# Patient Record
Sex: Female | Born: 1965 | Hispanic: Yes | Marital: Single | Smoking: Never smoker
Health system: Southern US, Community
[De-identification: ages and names within clinical notes are randomized; demographics above are authoritative.]

## PROBLEM LIST (undated history)

## (undated) DIAGNOSIS — R7303 Prediabetes: Secondary | ICD-10-CM

## (undated) DIAGNOSIS — E7800 Pure hypercholesterolemia, unspecified: Secondary | ICD-10-CM

## (undated) HISTORY — DX: Prediabetes: R73.03

## (undated) HISTORY — DX: Pure hypercholesterolemia, unspecified: E78.00

---

## 2019-07-13 HISTORY — PX: ANKLE SURGERY: SHX546

## 2019-08-30 ENCOUNTER — Encounter: Payer: Self-pay | Admitting: Obstetrics and Gynecology

## 2019-09-27 ENCOUNTER — Other Ambulatory Visit: Payer: Self-pay

## 2019-09-27 ENCOUNTER — Ambulatory Visit (INDEPENDENT_AMBULATORY_CARE_PROVIDER_SITE_OTHER): Payer: Self-pay | Admitting: Obstetrics and Gynecology

## 2019-09-27 ENCOUNTER — Encounter: Payer: Self-pay | Admitting: Obstetrics and Gynecology

## 2019-09-27 DIAGNOSIS — D259 Leiomyoma of uterus, unspecified: Secondary | ICD-10-CM | POA: Insufficient documentation

## 2019-09-27 DIAGNOSIS — D251 Intramural leiomyoma of uterus: Secondary | ICD-10-CM

## 2019-09-27 DIAGNOSIS — N83201 Unspecified ovarian cyst, right side: Secondary | ICD-10-CM

## 2019-09-27 NOTE — Patient Instructions (Signed)
Quiste ovrico Ovarian Cyst Un quiste ovrico es una bolsa llena de lquido que se forma en el ovario. Los ovarios son rganos de las mujeres que producen vulos. La mayora de los quistes ovricos desaparecen por s solos y no son cancerosos (son benignos). Otros quistes necesitan tratamiento. Siga estas indicaciones en su casa:  Tome los medicamentos de venta libre y los recetados solamente como se lo haya indicado el mdico.  No conduzca ni use maquinaria pesada mientras toma analgsicos recetados.  Realcese exmenes plvicos y pruebas de Papanicolaou con la frecuencia que le haya indicado el mdico.  Retome sus actividades habituales como se lo haya indicado el mdico. Pregntele al mdico qu actividades son seguras para usted.  No consuma ningn producto que contenga nicotina o tabaco, como cigarrillos y Psychologist, sport and exercise. Si necesita ayuda para dejar de fumar, consulte al mdico.  Concurra a todas las visitas de control como se lo haya indicado el mdico. Esto es importante. Comunquese con un mdico si:  Los perodos menstruales: ? Se retrasan. ? Son irregulares. ? Son dolorosos.   Sus perodos cesan.  Tiene dolor plvico que no desaparece.  Siente presin en la vejiga.  Tiene dificultad para vaciar la vejiga cuando orina.  Siente dolor durante las [employer].  Le aparece alguno de los siguientes sntomas en el abdomen: ? Sensacin de Environmental health practitioner lleno. ? Presin. ? Molestias. ? Dolor que persiste. ? Hinchazn.  Se siente mal constantemente.  Tiene dificultades para defecar (estreimiento).  No tiene el apetito habitual (pierde el apetito).  Tiene un acn muy grave.  Nota un aumento del vello corporal y facial.  Aumenta o disminuye de peso sin hacer modificaciones en su actividad fsica y en su dieta habitual.  Cree que puede estar [place]. Solicite ayuda de inmediato si:  Siente en el abdomen un dolor muy intenso o que  [place].  No puede comer ni beber sin vomitar.  Sbitamente tiene fiebre.  Los perodos menstruales son ms abundantes de lo habitual. Esta informacin no tiene Marine scientist el consejo del mdico. Asegrese de hacerle al mdico cualquier pregunta que tenga. Document Revised: 03/27/2016 Document Reviewed: 05/25/2015 Elsevier Patient Education  2020 [employer].

## 2019-09-27 NOTE — Progress Notes (Signed)
   Subjective:    Patient ID: Michelle Buchanan, female    DOB: [birth year], 54 y.o.   MRN: [id number]  HPI 54 yo G2P2.  SVD x 2.  Pt seen at [facility].  Pt initially seen at [facility].  Pt had concerning ultrasound with fibroid and large right ovarian cyst.  Pt felt more uncomfortable sleeping on her stomach.  Pt is postmenopausal for 6 years.  Pt denies vasomotor symptoms.  Pt has felt "abnormal" since February.  Pt notes early satiety.  Pt feels like she has gained weight.  Pt did note recent ankle surgery.     Review of Systems  Constitutional: Positive for appetite change and unexpected weight change.  HENT: Negative.   Eyes: Negative.   Respiratory: Negative.   Cardiovascular: Negative.   Gastrointestinal: Positive for abdominal pain. Negative for vomiting.       Early satiety  Endocrine: Negative.   Genitourinary: Positive for pelvic pain.  Musculoskeletal: Negative.   Allergic/Immunologic: Negative.   Neurological: Negative.   Hematological: Negative.   Psychiatric/Behavioral: Negative.        Objective:   Physical Exam Vitals reviewed.  Constitutional:      Appearance: Normal appearance. She is normal weight.  HENT:     Head: Normocephalic and atraumatic.  Cardiovascular:     Rate and Rhythm: Normal rate and regular rhythm.     Heart sounds: Normal heart sounds.  Pulmonary:     Effort: Pulmonary effort is normal.     Breath sounds: Normal breath sounds.  Abdominal:     Palpations: Abdomen is soft. There is mass.     Tenderness: There is no abdominal tenderness. There is no guarding.  Genitourinary:    Comments: Bimanual exam: uterus WNL, no CMT Pelvic mass palpated, hard to determine laterality Musculoskeletal:        General: Normal range of motion.  Neurological:     General: No focal deficit present.     Mental Status: She is alert and oriented to person, place, and time.   There were no vitals filed for this visit.  Outside u/s taken  05/2019:  Uterus 8.8 x 4.8 x 5.1 cm, small fibroid 3.6 x 3.3 x 3.4 cm Cystic structure right pelvis 14.5 x 15 x 14.4 cm , multiseptated      Assessment & Plan:   1. Intramural leiomyoma of uterus Reevaluate with u/s - US PELVIC COMPLETE WITH TRANSVAGINAL; Future  2. Right ovarian cyst Repeat u/s, check tumor markers, consider gyn oncology pending scan - CEA - CA 125 - US PELVIC COMPLETE WITH TRANSVAGINAL; Future

## 2019-09-28 LAB — CEA: CEA: 2 ng/mL (ref 0.0–4.7)

## 2019-09-28 LAB — CA 125: Cancer Antigen (CA) 125: 12.6 U/mL (ref 0.0–38.1)

## 2019-10-03 ENCOUNTER — Other Ambulatory Visit: Payer: Self-pay

## 2019-10-03 ENCOUNTER — Ambulatory Visit
Admission: RE | Admit: 2019-10-03 | Discharge: 2019-10-03 | Disposition: A | Discharge status: Home / Self Care | Payer: Self-pay | Source: Ambulatory Visit | Attending: Obstetrics and Gynecology | Admitting: Obstetrics and Gynecology

## 2019-10-03 DIAGNOSIS — N83201 Unspecified ovarian cyst, right side: Secondary | ICD-10-CM | POA: Insufficient documentation

## 2019-10-03 DIAGNOSIS — D251 Intramural leiomyoma of uterus: Secondary | ICD-10-CM | POA: Insufficient documentation

## 2019-10-04 ENCOUNTER — Telehealth: Payer: Self-pay | Admitting: *Deleted

## 2019-10-04 ENCOUNTER — Other Ambulatory Visit (INDEPENDENT_AMBULATORY_CARE_PROVIDER_SITE_OTHER): Payer: Self-pay | Admitting: Obstetrics and Gynecology

## 2019-10-04 ENCOUNTER — Encounter: Payer: Self-pay | Admitting: Gynecologic Oncology

## 2019-10-04 DIAGNOSIS — R1900 Intra-abdominal and pelvic swelling, mass and lump, unspecified site: Secondary | ICD-10-CM

## 2019-10-04 NOTE — Progress Notes (Signed)
Pt to be referred to gyn oncology

## 2019-10-04 NOTE — Telephone Encounter (Signed)
Dr Gordy Councilman office called and scheduled the patient for an appt tomorrow. They will contact the patient

## 2019-10-04 NOTE — Telephone Encounter (Signed)
Attempted to reach the patient by using [employer]; no answer and voicemail not set up. Attempted to reach the patient's family contact, call unable to go through

## 2019-10-05 ENCOUNTER — Encounter: Payer: Self-pay | Admitting: Gynecologic Oncology

## 2019-10-05 ENCOUNTER — Inpatient Hospital Stay: Payer: Self-pay

## 2019-10-05 ENCOUNTER — Other Ambulatory Visit: Payer: Self-pay

## 2019-10-05 ENCOUNTER — Inpatient Hospital Stay: Payer: Self-pay | Attending: Gynecologic Oncology | Admitting: Gynecologic Oncology

## 2019-10-05 VITALS — BP 136/60 | HR 63 | Temp 97.7°F | Resp 18 | Ht 62.0 in | Wt 181.2 lb

## 2019-10-05 DIAGNOSIS — R1900 Intra-abdominal and pelvic swelling, mass and lump, unspecified site: Secondary | ICD-10-CM

## 2019-10-05 DIAGNOSIS — R140 Abdominal distension (gaseous): Secondary | ICD-10-CM

## 2019-10-05 DIAGNOSIS — Z6833 Body mass index (BMI) 33.0-33.9, adult: Secondary | ICD-10-CM | POA: Insufficient documentation

## 2019-10-05 DIAGNOSIS — E669 Obesity, unspecified: Secondary | ICD-10-CM | POA: Insufficient documentation

## 2019-10-05 DIAGNOSIS — E7800 Pure hypercholesterolemia, unspecified: Secondary | ICD-10-CM | POA: Insufficient documentation

## 2019-10-05 DIAGNOSIS — R7303 Prediabetes: Secondary | ICD-10-CM | POA: Insufficient documentation

## 2019-10-05 LAB — BASIC METABOLIC PANEL
Anion gap: 9 (ref 5–15)
BUN: 15 mg/dL (ref 6–20)
CO2: 27 mmol/L (ref 22–32)
Calcium: 9.2 mg/dL (ref 8.9–10.3)
Chloride: 105 mmol/L (ref 98–111)
Creatinine, Ser: 0.78 mg/dL (ref 0.44–1.00)
GFR calc Af Amer: >60 mL/min (ref >60)
GFR calc non Af Amer: >60 mL/min (ref >60)
Glucose, Bld: 113 mg/dL — ABNORMAL HIGH (ref 70–99)
Potassium: 3.6 mmol/L (ref 3.5–5.1)
Sodium: 141 mmol/L (ref 135–145)

## 2019-10-05 MED ORDER — SENNOSIDES-DOCUSATE SODIUM 8.6-50 MG PO TABS: 2.0000 | ORAL_TABLET | Freq: Every day | ORAL | 0 refills | Status: AC

## 2019-10-05 MED ORDER — OXYCODONE HCL 5 MG PO TABS: 5.0000 mg | ORAL_TABLET | ORAL | 0 refills | Status: AC | PRN

## 2019-10-05 MED ORDER — IBUPROFEN 800 MG PO TABS: 800.0000 mg | ORAL_TABLET | Freq: Three times a day (TID) | ORAL | 0 refills | Status: AC | PRN

## 2019-10-05 NOTE — Progress Notes (Signed)
Consult Note: Gyn-Onc Patient seen with interpretor present.   Consult was requested by Dr. Elgie Congo for the evaluation of [facility] 54 y.o. female  CC:  Chief Complaint  Patient presents with  . Pelvic mass    Assessment/Plan:  [name]  is a 54 y.o.  year old with a cystic pelvic mass, likely arising from the left ovary.  It is mildly complex with a single thin septation on ultrasound scan and associated with a normal Ca1 25 and CEA, therefore I have a low suspicion of malignancy.  However due to its large size I am recommending a CT scan of the abdomen and pelvis to evaluate the upper abdomen in addition and adjacent visceral structures.  I am recommending a unilateral salpingo-oophorectomy via mini laparotomy incision and cyst decompression.  I counseled the patient regarding the surgery, its potential for complication including  bleeding, infection, damage to internal organs (such as bladder,ureters, bowels), blood clot, reoperation and rehospitalization. I counseled the patient regarding postoperative recovery.  I am recommending 4 weeks of no heavy lifting.  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  This is reasonable.  We will write her for 2 weeks off of work and an additional 2 weeks of lifting restrictions with no lifting greater than 10 pounds until she is at 4 weeks postop.  At 4 weeks postop she can return to all routine activities provided that she is healing well.  Surgery is scheduled for October 28th.   HPI: [name] is a 54 year old P2 who was seen in consultation at the request of Dr Lynnda Shields for evaluation of a large ovarian cyst.  The patient first noticed abdominal distention and pain with squatting to lifting in March 2021.  She was seen by the health department in May 2021 and ultrasound scan was performed which revealed a uterus measuring 8.8 x 4.8 x 5.1 cm and a small fibroid  measuring 3.6 cm.  There is a cystic structure which was felt at that time to be in the right pelvis and measured 14.5 x 15 x 14.4 cm it was multiseptated.  She was referred to an OB/GYN.  She saw Dr. Lynnda Shields on September 27, 2019 and a transvaginal ultrasound scan was ordered.  This was performed on October 03, 2019 and revealed a uterus measuring 9.6 x 5.2 x 5.5 cm with small leiomyomas within it.  The endometrium was thin and normal at 3 mm.  The right ovary was grossly normal at 2.8 x 0.9 x 1.8 cm.  The left ovary was not visualized.  However a large cystic mass was seen within the pelvis likely arising from the left adnexa extending above the umbilicus measuring 00.1 x 11.3 x 19.9 cm.  It contained a small amount of debris.  There was no mural nodularity or papillary excrescences.  There was a single thin septation.  There was no abnormal blood flow on color or color Doppler imaging.  No pelvic free fluid.  Ca1 25 was drawn on September 27, 2019 and was normal at 12.6, as was CEA at 2.0.  The patient is otherwise a fairly healthy woman who has regular primary care with annual visits.  She has a history of being diagnosed with hypercholesterolemia and prediabetes.  She has no past surgical history other than a left ankle surgery in July 2021 for fracture.  She is obese with a BMI  of 33 kg per metered squared.  Her gynecologic history is remarkable for menopause at approximately age 54.  She has had no postmenopausal bleeding.  She had 2 prior vaginal deliveries.  Her family cancer history is unremarkable.  She works as a Insurance account manager. She lives alone.   Current Meds:  Outpatient Encounter Medications as of 10/05/2019  Medication Sig  . acetaminophen (TYLENOL) 500 MG tablet Take 500 mg by mouth every 6 (six) hours as needed.  . Cholecalciferol 50 MCG (2000 UT) TABS Take by mouth.  . cyclobenzaprine (FLEXERIL) 5 MG tablet Take 5 mg by mouth 3 (three) times daily as needed.  Marland Kitchen ibuprofen (ADVIL)  800 MG tablet Take 800 mg by mouth every 6 (six) hours as needed.   No facility-administered encounter medications on file as of 10/05/2019.    Allergy:  Allergies  Allergen Reactions  . Pollen Extract     Social Hx:   Social History   Socioeconomic History  . Marital status: Single    Spouse name: Not on file  . Number of children: Not on file  . Years of education: Not on file  . Highest education level: Not on file  Occupational History  . Not on file  Tobacco Use  . Smoking status: Never Smoker  . Smokeless tobacco: Never Used  Vaping Use  . Vaping Use: Never used  Substance and Sexual Activity  . Alcohol use: Never  . Drug use: Never  . Sexual activity: Not Currently  Other Topics Concern  . Not on file  Social History Narrative  . Not on file   Social Determinants of Health   Financial Resource Strain:   . Difficulty of Paying Living Expenses: Not on file  Food Insecurity:   . Worried About Charity fundraiser in the Last Year: Not on file  . Ran Out of Food in the Last Year: Not on file  Transportation Needs:   . Lack of Transportation (Medical): Not on file  . Lack of Transportation (Non-Medical): Not on file  Physical Activity:   . Days of Exercise per Week: Not on file  . Minutes of Exercise per Session: Not on file  Stress:   . Feeling of Stress : Not on file  Social Connections:   . Frequency of Communication with Friends and Family: Not on file  . Frequency of Social Gatherings with Friends and Family: Not on file  . Attends Religious Services: Not on file  . Active Member of Clubs or Organizations: Not on file  . Attends Archivist Meetings: Not on file  . Marital Status: Not on file  Intimate Partner Violence:   . Fear of Current or Ex-Partner: Not on file  . Emotionally Abused: Not on file  . Physically Abused: Not on file  . Sexually Abused: Not on file    Past Surgical Hx:  Past Surgical History:  Procedure Laterality Date   . ANKLE SURGERY  07/13/2019    Past Medical Hx:  Past Medical History:  Diagnosis Date  . High cholesterol   . Prediabetes     Past Gynecological History:  See HPI No LMP recorded (lmp unknown). Patient is postmenopausal.  Family Hx:  Family History  Problem Relation Age of Onset  . Hypertension Mother   . Hyperlipidemia Mother   . Diabetes Sister   . Hyperlipidemia Sister   . Diabetes Brother   . Hyperlipidemia Brother     Review of Systems:  Constitutional  Feels well,  ENT Normal appearing ears and nares bilaterally Skin/Breast  No rash, sores, jaundice, itching, dryness Cardiovascular  No chest pain, shortness of breath, or edema  Pulmonary  No cough or wheeze.  Gastro Intestinal  No nausea, vomitting, or diarrhoea. No bright red blood per rectum, change in bowel movement, or constipation.  Genito Urinary  No frequency, urgency, dysuria, + pelvic and abdominal pain.  Musculo Skeletal  No myalgia, arthralgia, joint swelling or pain  Neurologic  No weakness, numbness, change in gait,  Psychology  No depression, anxiety, insomnia.   Vitals:  Blood pressure 136/60, pulse 63, temperature 97.7 F (36.5 C), temperature source Tympanic, resp. rate 18, height 5\' 2"  (1.575 m), weight 181 lb 3.2 oz (82.2 kg), SpO2 98 %.  Physical Exam: WD in NAD Neck  Supple NROM, without any enlargements.  Lymph Node Survey No cervical supraclavicular or inguinal adenopathy Cardiovascular  Pulse normal rate, regularity and rhythm. S1 and S2 normal.  Lungs  Clear to auscultation bilateraly, without wheezes/crackles/rhonchi. Good air movement.  Skin  No rash/lesions/breakdown  Psychiatry  Alert and oriented to person, place, and time  Abdomen  Normoactive bowel sounds, abdomen soft, and obese without evidence of hernia. Palpable mass extending to umbilicus, smooth. Minimally tender.  Back No CVA tenderness Genito Urinary  Vulva/vagina: Normal external female genitalia.   No lesions. No discharge or bleeding.  Bladder/urethra:  No lesions or masses, well supported bladder  Vagina: normal  Cervix: Normal appearing, no lesions.  Uterus:  Small, mobile, no parametrial involvement or nodularity.  Adnexa: cystic pelvic mass extending into upper abdomen. Rectal  deferred Extremities  No bilateral cyanosis, clubbing or edema.  60 minutes of total time was spent for this patient encounter, including preparation, face-to-face counseling with the patient and coordination of care, review of imaging (results and images), communication with the referring provider and documentation of the encounter.   Thereasa Solo, MD  10/05/2019, 10:30 AM

## 2019-10-05 NOTE — Patient Instructions (Signed)
Preparing for your Surgery  Plan for surgery on November 01, 2019 with Dr. Everitt Amber at [facility] will be scheduled for a mini laparotomy (incision on your abdomen), unilateral salpingo-oophorectomy (removal of one ovary and fallopian tube).   Pre-operative Testing -You will receive a phone call from presurgical testing at [facility] to arrange for a pre-operative appointment over the phone, lab appointment, and COVID test. The COVID test normally happens 3 days prior to the surgery and they ask that you self quarantine after the test up until surgery to decrease chance of exposure.  -Bring your insurance card, copy of an advanced directive if applicable, medication list  -At that visit, you will be asked to sign a consent for a possible blood transfusion in case a transfusion becomes necessary during surgery.  The need for a blood transfusion is rare but having consent is a necessary part of your care.     -You should not be taking blood thinners or aspirin at least ten days prior to surgery unless instructed by your surgeon.  -Do not take supplements such as fish oil (omega 3), red yeast rice, turmeric before your surgery.   Day Before Surgery at [place] will be asked to take in a light diet the day before surgery. You will be advised you can have clear liquids up until 3 hours before your surgery.    Eat a light diet the day before surgery.  Examples including soups, broths, toast, yogurt, mashed potatoes.  AVOID GAS PRODUCING FOODS. Things to avoid include carbonated beverages (fizzy beverages), raw fruits and raw vegetables, or beans.   If your bowels are filled with gas, your surgeon will have difficulty visualizing your pelvic organs which increases your surgical risks.  Your role in recovery Your role is to become active as soon as directed by your doctor, while still giving yourself time to heal.  Rest when you feel tired. You will be asked to do the  following in order to speed your recovery:  - Cough and breathe deeply. This helps to clear and expand your lungs and can prevent pneumonia after surgery.  - [place]. Do mild physical activity. Walking or moving your legs help your circulation and body functions return to normal. Do not try to get up or walk alone the first time after surgery.   -If you develop swelling on one leg or the other, pain in the back of your leg, redness/warmth in one of your legs, please call the office or go to the Emergency Room to have a doppler to rule out a blood clot. For shortness of breath, chest pain-seek care in the Emergency Room as soon as possible. - Actively manage your pain. Managing your pain lets you move in comfort. We will ask you to rate your pain on a scale of zero to 10. It is your responsibility to tell your doctor or nurse where and how much you hurt so your pain can be treated.  Special Considerations -If you are diabetic, you may be placed on insulin after surgery to have closer control over your blood sugars to promote healing and recovery.  This does not mean that you will be discharged on insulin.  If applicable, your oral antidiabetics will be resumed when you are tolerating a solid diet.  -Your final pathology results from surgery should be available around one week after surgery and the results will be relayed to you when available.  -  Dr. Lahoma Crocker is the surgeon that assists your GYN Oncologist with surgery.  If you end up staying the night, the next day after your surgery you will either see Dr. Denman George, Dr. Berline Lopes, or Dr. Lahoma Crocker.  -FMLA forms can be faxed to [phone] and please allow 5-7 business days for completion.  Pain Management After Surgery -You have been prescribed your pain medication and bowel regimen medications before surgery so that you can have these available when you are discharged from the hospital. The pain medication is for  use ONLY AFTER surgery and a new prescription will not be given.   -Make sure that you have Tylenol and Ibuprofen at home to use on a regular basis after surgery for pain control. We recommend alternating the medications every hour to six hours since they work differently and are processed in the body differently for pain relief.  -Review the attached handout on narcotic use and their risks and side effects.   Bowel Regimen -You have been prescribed Sennakot-S to take nightly to prevent constipation especially if you are taking the narcotic pain medication intermittently.  It is important to prevent constipation and drink adequate amounts of liquids. You can stop taking this medication when you are not taking pain medication and you are back on your normal bowel routine.  Risks of Surgery Risks of surgery are low but include bleeding, infection, damage to surrounding structures, re-operation, blood clots, and very rarely death.   Blood Transfusion Information (For the consent to be signed before surgery)  We will be checking your blood type before surgery so in case of emergencies, we will know what type of blood you would need.                                            WHAT IS A BLOOD TRANSFUSION?  A transfusion is the replacement of blood or some of its parts. Blood is made up of multiple cells which provide different functions.  Red blood cells carry oxygen and are used for blood loss replacement.  White blood cells fight against infection.  Platelets control bleeding.  Plasma helps clot blood.  Other blood products are available for specialized needs, such as hemophilia or other clotting disorders. BEFORE THE TRANSFUSION  Who gives blood for transfusions?   You may be able to donate blood to be used at a later date on yourself (autologous donation).  Relatives can be asked to donate blood. This is generally not any safer than if you have received blood from a stranger. The  same precautions are taken to ensure safety when a relative's blood is donated.  Healthy volunteers who are fully evaluated to make sure their blood is safe. This is blood bank blood. Transfusion therapy is the safest it has ever been in the practice of medicine. Before blood is taken from a donor, a complete history is taken to make sure that person has no history of diseases nor engages in risky social behavior (examples are intravenous drug use or sexual activity with multiple partners). The donor's travel history is screened to minimize risk of transmitting infections, such as malaria. The donated blood is tested for signs of infectious diseases, such as HIV and hepatitis. The blood is then tested to be sure it is compatible with you in order to minimize the chance of a transfusion reaction. If you or  a relative donates blood, this is often done in anticipation of surgery and is not appropriate for emergency situations. It takes many days to process the donated blood. RISKS AND COMPLICATIONS Although transfusion therapy is very safe and saves many lives, the main dangers of transfusion include:   Getting an infectious disease.  Developing a transfusion reaction. This is an allergic reaction to something in the blood you were given. Every precaution is taken to prevent this. The decision to have a blood transfusion has been considered carefully by your caregiver before blood is given. Blood is not given unless the benefits outweigh the risks.  AFTER SURGERY INSTRUCTIONS  Return to work: 4 weeks if applicable  Activity: 1. Be up and out of the bed during the day.  Take a nap if needed.  You may walk up steps but be careful and use the hand rail.  Stair climbing will tire you more than you think, you may need to stop part way and rest.   2. No lifting or straining for 6 weeks over 10 pounds. No pushing, pulling, straining for 6 weeks.  3. No driving for 1-2 week(s).  Do not drive if you are  taking narcotic pain medicine and make sure that your reaction time has returned.   4. You can shower as soon as the next day after surgery. Shower daily.  Use your regular soap and water (not directly on the incision) and pat your incision(s) dry afterwards; don't rub.  No tub baths or submerging your body in water until cleared by your surgeon. If you have the soap that was given to you by pre-surgical testing that was used before surgery, you do not need to use it afterwards because this can irritate your incisions.   5. No sexual activity and nothing in the vagina for 4 weeks.  6. You may experience a small amount of clear drainage from your incisions, which is normal.  If the drainage persists, increases, or changes color please call the office.  7. Do not use creams, lotions, or ointments such as neosporin on your incisions after surgery until advised by your surgeon because they can cause removal of the dermabond glue on your incisions.    8. You may experience vaginal spotting after surgery or around the 6-8 week mark from surgery when the stitches at the top of the vagina begin to dissolve.  The spotting is normal but if you experience heavy bleeding, call our office.  9. Take Tylenol or ibuprofen first for pain and only use narcotic pain medication for severe pain not relieved by the Tylenol or Ibuprofen.  Monitor your Tylenol intake to a max of 4,000 mg in a 24 hour period. You can alternate these medications after surgery.  Diet: 1. Low sodium Heart Healthy Diet is recommended but you are cleared to resume your normal (before surgery) diet after your procedure.  2. It is safe to use a laxative, such as Miralax or Colace, if you have difficulty moving your bowels. You have been prescribed Sennakot at bedtime every evening to keep bowel movements regular and to prevent constipation.    Wound Care: 1. Keep clean and dry.  Shower daily.  Reasons to call the Doctor:  Fever - Oral  temperature greater than 100.4 degrees Fahrenheit  Foul-smelling vaginal discharge  Difficulty urinating  Nausea and vomiting  Increased pain at the site of the incision that is unrelieved with pain medicine.  Difficulty breathing with or without chest pain  New calf pain especially if only on one side  Sudden, continuing increased vaginal bleeding with or without clots.   Contacts: For questions or concerns you should contact:  Dr. Everitt Amber at [phone]  Joylene John, NP at [phone]  After Hours: call [phone] and have the GYN Oncologist paged/contacted (after 5 pm or on the weekends)

## 2019-10-05 NOTE — H&P (View-Only) (Signed)
Consult Note: Gyn-Onc Patient seen with interpretor present.   Consult was requested by Dr. Elgie Congo for the evaluation of [facility] 54 y.o. female  CC:  Chief Complaint  Patient presents with  . Pelvic mass    Assessment/Plan:  [name] Buchanan  is a 53 y.o.  year old with a cystic pelvic mass, likely arising from the left ovary.  It is mildly complex with a single thin septation on ultrasound scan and associated with a normal Ca1 25 and CEA, therefore I have a low suspicion of malignancy.  However due to its large size I am recommending a CT scan of the abdomen and pelvis to evaluate the upper abdomen in addition and adjacent visceral structures.  I am recommending a unilateral salpingo-oophorectomy via mini laparotomy incision and cyst decompression.  I counseled the patient regarding the surgery, its potential for complication including  bleeding, infection, damage to internal organs (such as bladder,ureters, bowels), blood clot, reoperation and rehospitalization. I counseled the patient regarding postoperative recovery.  I am recommending 4 weeks of no heavy lifting.  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  This is reasonable.  We will write her for 2 weeks off of work and an additional 2 weeks of lifting restrictions with no lifting greater than 10 pounds until she is at 4 weeks postop.  At 4 weeks postop she can return to all routine activities provided that she is healing well.  Surgery is scheduled for October 28th.   HPI: [name] Buchanan is a 54 year old P2 who was seen in consultation at the request of Dr Lynnda Shields for evaluation of a large ovarian cyst.  The patient first noticed abdominal distention and pain with squatting to lifting in March 2021.  She was seen by the health department in May 2021 and ultrasound scan was performed which revealed a uterus measuring 8.8 x 4.8 x 5.1 cm and a small fibroid  measuring 3.6 cm.  There is a cystic structure which was felt at that time to be in the right pelvis and measured 14.5 x 15 x 14.4 cm it was multiseptated.  She was referred to an OB/GYN.  She saw Dr. Lynnda Shields on September 27, 2019 and a transvaginal ultrasound scan was ordered.  This was performed on October 03, 2019 and revealed a uterus measuring 9.6 x 5.2 x 5.5 cm with small leiomyomas within it.  The endometrium was thin and normal at 3 mm.  The right ovary was grossly normal at 2.8 x 0.9 x 1.8 cm.  The left ovary was not visualized.  However a large cystic mass was seen within the pelvis likely arising from the left adnexa extending above the umbilicus measuring 10.9 x 11.3 x 19.9 cm.  It contained a small amount of debris.  There was no mural nodularity or papillary excrescences.  There was a single thin septation.  There was no abnormal blood flow on color or color Doppler imaging.  No pelvic free fluid.  Ca1 25 was drawn on September 27, 2019 and was normal at 12.6, as was CEA at 2.0.  The patient is otherwise a fairly healthy woman who has regular primary care with annual visits.  She has a history of being diagnosed with hypercholesterolemia and prediabetes.  She has no past surgical history other than a left ankle surgery in July 2021 for fracture.  She is obese with a BMI  of 33 kg per metered squared.  Her gynecologic history is remarkable for menopause at approximately age 21.  She has had no postmenopausal bleeding.  She had 2 prior vaginal deliveries.  Her family cancer history is unremarkable.  She works as a Insurance account manager. She lives alone.   Current Meds:  Outpatient Encounter Medications as of 10/05/2019  Medication Sig  . acetaminophen (TYLENOL) 500 MG tablet Take 500 mg by mouth every 6 (six) hours as needed.  . Cholecalciferol 50 MCG (2000 UT) TABS Take by mouth.  . cyclobenzaprine (FLEXERIL) 5 MG tablet Take 5 mg by mouth 3 (three) times daily as needed.  Marland Kitchen ibuprofen (ADVIL)  800 MG tablet Take 800 mg by mouth every 6 (six) hours as needed.   No facility-administered encounter medications on file as of 10/05/2019.    Allergy:  Allergies  Allergen Reactions  . Pollen Extract     Social Hx:   Social History   Socioeconomic History  . Marital status: Single    Spouse name: Not on file  . Number of children: Not on file  . Years of education: Not on file  . Highest education level: Not on file  Occupational History  . Not on file  Tobacco Use  . Smoking status: Never Smoker  . Smokeless tobacco: Never Used  Vaping Use  . Vaping Use: Never used  Substance and Sexual Activity  . Alcohol use: Never  . Drug use: Never  . Sexual activity: Not Currently  Other Topics Concern  . Not on file  Social History Narrative  . Not on file   Social Determinants of Health   Financial Resource Strain:   . Difficulty of Paying Living Expenses: Not on file  Food Insecurity:   . Worried About Charity fundraiser in the Last Year: Not on file  . Ran Out of Food in the Last Year: Not on file  Transportation Needs:   . Lack of Transportation (Medical): Not on file  . Lack of Transportation (Non-Medical): Not on file  Physical Activity:   . Days of Exercise per Week: Not on file  . Minutes of Exercise per Session: Not on file  Stress:   . Feeling of Stress : Not on file  Social Connections:   . Frequency of Communication with Friends and Family: Not on file  . Frequency of Social Gatherings with Friends and Family: Not on file  . Attends Religious Services: Not on file  . Active Member of Clubs or Organizations: Not on file  . Attends Archivist Meetings: Not on file  . Marital Status: Not on file  Intimate Partner Violence:   . Fear of Current or Ex-Partner: Not on file  . Emotionally Abused: Not on file  . Physically Abused: Not on file  . Sexually Abused: Not on file    Past Surgical Hx:  Past Surgical History:  Procedure Laterality Date   . ANKLE SURGERY  07/13/2019    Past Medical Hx:  Past Medical History:  Diagnosis Date  . High cholesterol   . Prediabetes     Past Gynecological History:  See HPI No LMP recorded (lmp unknown). Patient is postmenopausal.  Family Hx:  Family History  Problem Relation Age of Onset  . Hypertension Mother   . Hyperlipidemia Mother   . Diabetes Sister   . Hyperlipidemia Sister   . Diabetes Brother   . Hyperlipidemia Brother     Review of Systems:  Constitutional  Feels well,  ENT Normal appearing ears and nares bilaterally Skin/Breast  No rash, sores, jaundice, itching, dryness Cardiovascular  No chest pain, shortness of breath, or edema  Pulmonary  No cough or wheeze.  Gastro Intestinal  No nausea, vomitting, or diarrhoea. No bright red blood per rectum, change in bowel movement, or constipation.  Genito Urinary  No frequency, urgency, dysuria, + pelvic and abdominal pain.  Musculo Skeletal  No myalgia, arthralgia, joint swelling or pain  Neurologic  No weakness, numbness, change in gait,  Psychology  No depression, anxiety, insomnia.   Vitals:  Blood pressure 136/60, pulse 63, temperature 97.7 F (36.5 C), temperature source Tympanic, resp. rate 18, height 5\' 2"  (1.575 m), weight 181 lb 3.2 oz (82.2 kg), SpO2 98 %.  Physical Exam: WD in NAD Neck  Supple NROM, without any enlargements.  Lymph Node Survey No cervical supraclavicular or inguinal adenopathy Cardiovascular  Pulse normal rate, regularity and rhythm. S1 and S2 normal.  Lungs  Clear to auscultation bilateraly, without wheezes/crackles/rhonchi. Good air movement.  Skin  No rash/lesions/breakdown  Psychiatry  Alert and oriented to person, place, and time  Abdomen  Normoactive bowel sounds, abdomen soft, and obese without evidence of hernia. Palpable mass extending to umbilicus, smooth. Minimally tender.  Back No CVA tenderness Genito Urinary  Vulva/vagina: Normal external female genitalia.   No lesions. No discharge or bleeding.  Bladder/urethra:  No lesions or masses, well supported bladder  Vagina: normal  Cervix: Normal appearing, no lesions.  Uterus:  Small, mobile, no parametrial involvement or nodularity.  Adnexa: cystic pelvic mass extending into upper abdomen. Rectal  deferred Extremities  No bilateral cyanosis, clubbing or edema.  60 minutes of total time was spent for this patient encounter, including preparation, face-to-face counseling with the patient and coordination of care, review of imaging (results and images), communication with the referring provider and documentation of the encounter.   Thereasa Solo, MD  10/05/2019, 10:30 AM

## 2019-10-08 ENCOUNTER — Other Ambulatory Visit: Payer: Self-pay | Admitting: Gynecologic Oncology

## 2019-10-08 DIAGNOSIS — R1900 Intra-abdominal and pelvic swelling, mass and lump, unspecified site: Secondary | ICD-10-CM

## 2019-10-11 ENCOUNTER — Ambulatory Visit (HOSPITAL_COMMUNITY)
Admission: RE | Admit: 2019-10-11 | Discharge: 2019-10-11 | Disposition: A | Discharge status: Home / Self Care | Payer: Self-pay | Source: Ambulatory Visit | Attending: Gynecologic Oncology | Admitting: Gynecologic Oncology

## 2019-10-11 ENCOUNTER — Other Ambulatory Visit: Payer: Self-pay

## 2019-10-11 DIAGNOSIS — R140 Abdominal distension (gaseous): Secondary | ICD-10-CM | POA: Insufficient documentation

## 2019-10-11 DIAGNOSIS — R1900 Intra-abdominal and pelvic swelling, mass and lump, unspecified site: Secondary | ICD-10-CM | POA: Insufficient documentation

## 2019-10-11 MED ORDER — IOHEXOL 300 MG/ML  SOLN
100.0000 mL | Freq: Once | INTRAMUSCULAR | Status: AC | PRN
  Administered 2019-10-11: 100 mL via INTRAVENOUS

## 2019-10-12 ENCOUNTER — Telehealth: Payer: Self-pay

## 2019-10-12 NOTE — Telephone Encounter (Signed)
Attempted  to reach patient to give results of CT scan from 10-11-19. No message left as the voice mail is not set up. Will try again later to reach patient.

## 2019-10-12 NOTE — Telephone Encounter (Signed)
Told ms  [facility] that the Ct shows the known pelvic mass. There are no enlarged lymph nodes or signs of metastatic or spread of cancer per Joylene John, NP. Pt verbalized understanding.

## 2019-10-24 NOTE — Progress Notes (Signed)
DUE TO COVID-19 ONLY ONE VISITOR IS ALLOWED TO COME WITH YOU AND STAY IN THE WAITING ROOM ONLY DURING PRE OP AND PROCEDURE DAY OF SURGERY. THE 1 VISITOR  MAY VISIT WITH YOU AFTER SURGERY IN YOUR PRIVATE ROOM DURING VISITING HOURS ONLY!  YOU NEED TO HAVE A COVID 19 TEST ON__10/25/2021 _____ @_______ , THIS TEST MUST BE DONE BEFORE SURGERY,  COVID TESTING SITE [address] [place] [address], IT IS ON THE RIGHT GOING OUT [address] APPROXIMATELY  2 MINUTES PAST [employer] ON THE RIGHT. ONCE YOUR COVID TEST IS COMPLETED,  PLEASE BEGIN THE QUARANTINE INSTRUCTIONS AS OUTLINED IN YOUR HANDOUT.                [place]  10/24/2019   Your procedure is scheduled on: 11/01/2019    Report to [facility] Main  Entrance   Report to admitting at     1100 AM     Call this number if you have problems the morning of surgery [phone]    Remember: Do not eat food , candy gum or mints :After Midnight. You may have clear liquids from midnight until 1000am    CLEAR LIQUID DIET   Foods Allowed                                                                       Coffee and tea, regular and decaf                              Plain Jell-O any favor except red or purple                                            Fruit ices (not with fruit pulp)                                      Iced Popsicles                                     Carbonated beverages, regular and diet                                    Cranberry, grape and apple juices Sports drinks like Gatorade Lightly seasoned clear broth or consume(fat free) Sugar, honey syrup   _____________________________________________________________________    BRUSH YOUR TEETH MORNING OF SURGERY AND RINSE YOUR MOUTH OUT, NO CHEWING GUM CANDY OR MINTS.     Take these medicines the morning of surgery with A SIP OF WATER: none                                  You may not have any metal on your body  including hair pins and  piercings  Do not wear jewelry, make-up, lotions, powders or perfumes, deodorant             Do not wear nail polish on your fingernails.  Do not shave  48 hours prior to surgery.              Men may shave face and neck.   Do not bring valuables to the hospital. [place].  Contacts, dentures or bridgework may not be worn into surgery.  Leave suitcase in the car. After surgery it may be brought to your room.     Patients discharged the day of surgery will not be allowed to drive home. IF YOU ARE HAVING SURGERY AND GOING HOME THE SAME DAY, YOU MUST HAVE AN ADULT TO DRIVE YOU HOME AND BE WITH YOU FOR 24 HOURS. YOU MAY GO HOME BY TAXI OR UBER OR ORTHERWISE, BUT AN ADULT MUST ACCOMPANY YOU HOME AND STAY WITH YOU FOR 24 HOURS.  Name and phone number of your driver:  Special Instructions: N/A              Please read over the following fact sheets you were given: _____________________________________________________________________  [facility] - Preparing for Surgery Before surgery, you can play an important role.  Because skin is not sterile, your skin needs to be as free of germs as possible.  You can reduce the number of germs on your skin by washing with CHG (chlorahexidine gluconate) soap before surgery.  CHG is an antiseptic cleaner which kills germs and bonds with the skin to continue killing germs even after washing. Please DO NOT use if you have an allergy to CHG or antibacterial soaps.  If your skin becomes reddened/irritated stop using the CHG and inform your nurse when you arrive at Short Stay. Do not shave (including legs and underarms) for at least 48 hours prior to the first CHG shower.  You may shave your face/neck. Please follow these instructions carefully:  1.  Shower with CHG Soap the night before surgery and the  morning of Surgery.  2.  If you choose to wash your hair, wash your hair first  as usual with your  normal  shampoo.  3.  After you shampoo, rinse your hair and body thoroughly to remove the  shampoo.                           4.  Use CHG as you would any other liquid soap.  You can apply chg directly  to the skin and wash                       Gently with a scrungie or clean washcloth.  5.  Apply the CHG Soap to your body ONLY FROM THE NECK DOWN.   Do not use on face/ open                           Wound or open sores. Avoid contact with eyes, ears mouth and genitals (private parts).                       Wash face,  Genitals (private parts) with your normal soap.             6.  Wash thoroughly, paying special attention to the area where your surgery  will be performed.  7.  Thoroughly rinse your body with warm water from the neck down.  8.  DO NOT shower/wash with your normal soap after using and rinsing off  the CHG Soap.                9.  Pat yourself dry with a clean towel.            10.  Wear clean pajamas.            11.  Place clean sheets on your bed the night of your first shower and do not  sleep with pets. Day of Surgery : Do not apply any lotions/deodorants the morning of surgery.  Please wear clean clothes to the hospital/surgery center.  FAILURE TO FOLLOW THESE INSTRUCTIONS MAY RESULT IN THE CANCELLATION OF YOUR SURGERY PATIENT SIGNATURE_________________________________  NURSE SIGNATURE__________________________________  ________________________________________________________________________

## 2019-10-26 ENCOUNTER — Encounter (HOSPITAL_COMMUNITY): Payer: Self-pay

## 2019-10-26 ENCOUNTER — Encounter (HOSPITAL_COMMUNITY)
Admission: RE | Admit: 2019-10-26 | Discharge: 2019-10-26 | Disposition: A | Discharge status: Home / Self Care | Payer: Self-pay | Source: Ambulatory Visit | Attending: Gynecologic Oncology | Admitting: Gynecologic Oncology

## 2019-10-26 ENCOUNTER — Other Ambulatory Visit: Payer: Self-pay

## 2019-10-26 DIAGNOSIS — Z01818 Encounter for other preprocedural examination: Secondary | ICD-10-CM | POA: Insufficient documentation

## 2019-10-26 HISTORY — DX: Prediabetes: R73.03

## 2019-10-26 LAB — BASIC METABOLIC PANEL
Anion gap: 9 (ref 5–15)
BUN: 18 mg/dL (ref 6–20)
CO2: 26 mmol/L (ref 22–32)
Calcium: 8.7 mg/dL — ABNORMAL LOW (ref 8.9–10.3)
Chloride: 105 mmol/L (ref 98–111)
Creatinine, Ser: 0.72 mg/dL (ref 0.44–1.00)
GFR, Estimated: >60 mL/min (ref >60)
Glucose, Bld: 96 mg/dL (ref 70–99)
Potassium: 4 mmol/L (ref 3.5–5.1)
Sodium: 140 mmol/L (ref 135–145)

## 2019-10-26 LAB — CBC
HCT: 39.7 % (ref 36.0–46.0)
Hemoglobin: 13.3 g/dL (ref 12.0–15.0)
MCH: 30 pg (ref 26.0–34.0)
MCHC: 33.5 g/dL (ref 30.0–36.0)
MCV: 89.4 fL (ref 80.0–100.0)
Platelets: 179 10*3/uL (ref 150–400)
RBC: 4.44 MIL/uL (ref 3.87–5.11)
RDW: 12.3 % (ref 11.5–15.5)
WBC: 6.5 10*3/uL (ref 4.0–10.5)
nRBC: 0 % (ref 0.0–0.2)

## 2019-10-26 NOTE — Progress Notes (Signed)
Patient unable to do urine sampe at time of preop on 10/26/2019.  Pt to come on Monday to do urinalysis on way to Covid test prior to surgery.

## 2019-10-27 LAB — HEMOGLOBIN A1C
Hgb A1c MFr Bld: 5.8 % — ABNORMAL HIGH (ref 4.8–5.6)
Mean Plasma Glucose: 120 mg/dL

## 2019-10-29 ENCOUNTER — Telehealth: Payer: Self-pay

## 2019-10-29 ENCOUNTER — Encounter (HOSPITAL_COMMUNITY)
Admission: RE | Admit: 2019-10-29 | Discharge: 2019-10-29 | Disposition: A | Discharge status: Home / Self Care | Payer: Self-pay | Source: Ambulatory Visit | Attending: Gynecologic Oncology | Admitting: Gynecologic Oncology

## 2019-10-29 ENCOUNTER — Other Ambulatory Visit (HOSPITAL_COMMUNITY)
Admission: RE | Admit: 2019-10-29 | Discharge: 2019-10-29 | Disposition: A | Discharge status: Home / Self Care | Payer: HRSA Program | Source: Ambulatory Visit | Attending: Gynecologic Oncology | Admitting: Gynecologic Oncology

## 2019-10-29 DIAGNOSIS — Z20822 Contact with and (suspected) exposure to covid-19: Secondary | ICD-10-CM | POA: Insufficient documentation

## 2019-10-29 LAB — URINALYSIS, ROUTINE W REFLEX MICROSCOPIC
Bacteria, UA: NONE SEEN
Bilirubin Urine: NEGATIVE
Glucose, UA: NEGATIVE mg/dL
Ketones, ur: NEGATIVE mg/dL
Leukocytes,Ua: NEGATIVE
Nitrite: NEGATIVE
Protein, ur: NEGATIVE mg/dL
Specific Gravity, Urine: 1.008 (ref 1.005–1.030)
pH: 7 (ref 5.0–8.0)

## 2019-10-29 LAB — SARS CORONAVIRUS 2 (TAT 6-24 HRS): SARS Coronavirus 2: NEGATIVE

## 2019-10-29 NOTE — Telephone Encounter (Signed)
Told Michelle Buchanan that the Hgb A1c was 5.8. This level in the pre-diabetic range and close to the diabetic range. She needs to watch her intake of concentrated sweets and carbohydrates such as bread and pasta. A high blood sugar can affect her healing after surgery. Pt verbalized understanding.

## 2019-10-29 NOTE — Progress Notes (Signed)
U/A done 10/29/2019 faxed via epic to DR Denman George and [facility],NP

## 2019-10-30 ENCOUNTER — Telehealth: Payer: Self-pay

## 2019-10-30 NOTE — Telephone Encounter (Signed)
Reviewed written pre op instructions with daughter Sharyn Lull, who speaks english. She understands the instructions and will be there to help mother with shower etc.  If she has any questions, She will call the office tomorrow at [phone].

## 2019-10-30 NOTE — Telephone Encounter (Signed)
Attempted to Reach patient via pacific interpreters service  Twice in one call.  No answer. Patient does not have a voice mail set up.

## 2019-10-31 NOTE — Anesthesia Preprocedure Evaluation (Addendum)
Anesthesia Evaluation  Patient identified by MRN, date of birth, ID band Patient awake    Reviewed: Allergy & Precautions, NPO status , Patient's Chart, lab work & pertinent test results  Airway Mallampati: II  TM Distance: >3 FB Neck ROM: Full    Dental no notable dental hx. (+) Missing,    Pulmonary neg pulmonary ROS,    Pulmonary exam normal breath sounds clear to auscultation       Cardiovascular Exercise Tolerance: Good Normal cardiovascular exam Rhythm:Regular Rate:Normal  10/26/19 EKG SB R 54   Neuro/Psych negative neurological ROS  negative psych ROS   GI/Hepatic negative GI ROS, Neg liver ROS,   Endo/Other  negative endocrine ROS  Renal/GU K+ 4.0 Cr 0.72  Female GU complaint Cystic pelvic mass from L ovary    Musculoskeletal negative musculoskeletal ROS (+)   Abdominal   Peds  Hematology Hgb 13.3 Plt 179   Anesthesia Other Findings   Reproductive/Obstetrics                          Anesthesia Physical Anesthesia Plan  ASA: III  Anesthesia Plan: General   Post-op Pain Management:    Induction: Intravenous  PONV Risk Score and Plan: 4 or greater and Midazolam, Treatment may vary due to age or medical condition, Ondansetron and Dexamethasone  Airway Management Planned: Oral ETT  Additional Equipment: None  Intra-op Plan:   Post-operative Plan: Extubation in OR  Informed Consent: I have reviewed the patients History and Physical, chart, labs and discussed the procedure including the risks, benefits and alternatives for the proposed anesthesia with the patient or authorized representative who has indicated his/her understanding and acceptance.     Dental advisory given and Interpreter used for interveiw  Plan Discussed with: Anesthesiologist and CRNA  Anesthesia Plan Comments: (GA)       Anesthesia Quick Evaluation

## 2019-11-01 ENCOUNTER — Encounter (HOSPITAL_COMMUNITY)
Admission: RE | Disposition: A | Discharge status: Home / Self Care | Payer: Self-pay | Source: Home / Self Care | Attending: Gynecologic Oncology

## 2019-11-01 ENCOUNTER — Encounter (HOSPITAL_COMMUNITY): Payer: Self-pay | Admitting: Gynecologic Oncology

## 2019-11-01 ENCOUNTER — Ambulatory Visit (HOSPITAL_COMMUNITY): Payer: Self-pay | Admitting: Anesthesiology

## 2019-11-01 ENCOUNTER — Ambulatory Visit (HOSPITAL_COMMUNITY)
Admission: RE | Admit: 2019-11-01 | Discharge: 2019-11-01 | Disposition: A | Discharge status: Home / Self Care | Payer: Self-pay | Attending: Gynecologic Oncology | Admitting: Gynecologic Oncology

## 2019-11-01 DIAGNOSIS — D259 Leiomyoma of uterus, unspecified: Secondary | ICD-10-CM | POA: Diagnosis present

## 2019-11-01 DIAGNOSIS — Z79899 Other long term (current) drug therapy: Secondary | ICD-10-CM | POA: Insufficient documentation

## 2019-11-01 DIAGNOSIS — E7800 Pure hypercholesterolemia, unspecified: Secondary | ICD-10-CM | POA: Insufficient documentation

## 2019-11-01 DIAGNOSIS — R1900 Intra-abdominal and pelvic swelling, mass and lump, unspecified site: Secondary | ICD-10-CM | POA: Diagnosis present

## 2019-11-01 DIAGNOSIS — Z8349 Family history of other endocrine, nutritional and metabolic diseases: Secondary | ICD-10-CM | POA: Insufficient documentation

## 2019-11-01 DIAGNOSIS — E669 Obesity, unspecified: Secondary | ICD-10-CM | POA: Insufficient documentation

## 2019-11-01 DIAGNOSIS — N83201 Unspecified ovarian cyst, right side: Secondary | ICD-10-CM | POA: Diagnosis present

## 2019-11-01 DIAGNOSIS — Z8249 Family history of ischemic heart disease and other diseases of the circulatory system: Secondary | ICD-10-CM | POA: Insufficient documentation

## 2019-11-01 DIAGNOSIS — D270 Benign neoplasm of right ovary: Secondary | ICD-10-CM

## 2019-11-01 DIAGNOSIS — Z833 Family history of diabetes mellitus: Secondary | ICD-10-CM | POA: Insufficient documentation

## 2019-11-01 DIAGNOSIS — Z6833 Body mass index (BMI) 33.0-33.9, adult: Secondary | ICD-10-CM | POA: Insufficient documentation

## 2019-11-01 DIAGNOSIS — Z791 Long term (current) use of non-steroidal anti-inflammatories (NSAID): Secondary | ICD-10-CM | POA: Insufficient documentation

## 2019-11-01 DIAGNOSIS — D271 Benign neoplasm of left ovary: Secondary | ICD-10-CM | POA: Insufficient documentation

## 2019-11-01 HISTORY — PX: SALPINGOOPHORECTOMY: SHX82

## 2019-11-01 LAB — TYPE AND SCREEN
ABO/RH(D): O POS
Antibody Screen: NEGATIVE

## 2019-11-01 LAB — ABO/RH: ABO/RH(D): O POS

## 2019-11-01 SURGERY — SALPINGO-OOPHORECTOMY, OPEN
Anesthesia: General | Laterality: Left

## 2019-11-01 MED ORDER — DEXAMETHASONE SODIUM PHOSPHATE 10 MG/ML IJ SOLN
INTRAMUSCULAR | Status: DC | PRN
  Administered 2019-11-01: 10 mg via INTRAVENOUS

## 2019-11-01 MED ORDER — HYDROMORPHONE HCL 1 MG/ML IJ SOLN
INTRAMUSCULAR | Status: AC
  Administered 2019-11-01: 0.5 mg via INTRAVENOUS
  Filled 2019-11-01: qty 1

## 2019-11-01 MED ORDER — AMISULPRIDE (ANTIEMETIC) 5 MG/2ML IV SOLN: 10.0000 mg | Freq: Once | INTRAVENOUS | Status: DC | PRN

## 2019-11-01 MED ORDER — LIDOCAINE 2% (20 MG/ML) 5 ML SYRINGE
INTRAMUSCULAR | Status: AC
  Filled 2019-11-01: qty 5

## 2019-11-01 MED ORDER — FENTANYL CITRATE (PF) 250 MCG/5ML IJ SOLN
INTRAMUSCULAR | Status: AC
  Filled 2019-11-01: qty 5

## 2019-11-01 MED ORDER — EPHEDRINE 5 MG/ML INJ
INTRAVENOUS | Status: AC
  Filled 2019-11-01: qty 10

## 2019-11-01 MED ORDER — MIDAZOLAM HCL 2 MG/2ML IJ SOLN
INTRAMUSCULAR | Status: AC
  Filled 2019-11-01: qty 2

## 2019-11-01 MED ORDER — EPHEDRINE SULFATE-NACL 50-0.9 MG/10ML-% IV SOSY
PREFILLED_SYRINGE | INTRAVENOUS | Status: DC | PRN
  Administered 2019-11-01: 10 mg via INTRAVENOUS

## 2019-11-01 MED ORDER — SUGAMMADEX SODIUM 200 MG/2ML IV SOLN
INTRAVENOUS | Status: DC | PRN
  Administered 2019-11-01: 170 mg via INTRAVENOUS

## 2019-11-01 MED ORDER — KETOROLAC TROMETHAMINE 30 MG/ML IJ SOLN
INTRAMUSCULAR | Status: DC | PRN
  Administered 2019-11-01: 30 mg via INTRAVENOUS

## 2019-11-01 MED ORDER — GABAPENTIN 300 MG PO CAPS
300.0000 mg | ORAL_CAPSULE | ORAL | Status: AC
  Administered 2019-11-01: 300 mg via ORAL
  Filled 2019-11-01: qty 1

## 2019-11-01 MED ORDER — ROCURONIUM BROMIDE 10 MG/ML (PF) SYRINGE
PREFILLED_SYRINGE | INTRAVENOUS | Status: DC | PRN
  Administered 2019-11-01: 50 mg via INTRAVENOUS

## 2019-11-01 MED ORDER — ROCURONIUM BROMIDE 10 MG/ML (PF) SYRINGE
PREFILLED_SYRINGE | INTRAVENOUS | Status: AC
  Filled 2019-11-01: qty 10

## 2019-11-01 MED ORDER — CEFAZOLIN SODIUM-DEXTROSE 2-4 GM/100ML-% IV SOLN
2.0000 g | INTRAVENOUS | Status: AC
  Administered 2019-11-01: 2 g via INTRAVENOUS
  Filled 2019-11-01: qty 100

## 2019-11-01 MED ORDER — ENOXAPARIN SODIUM 40 MG/0.4ML ~~LOC~~ SOLN
40.0000 mg | SUBCUTANEOUS | Status: AC
  Administered 2019-11-01: 40 mg via SUBCUTANEOUS
  Filled 2019-11-01: qty 0.4

## 2019-11-01 MED ORDER — SUCCINYLCHOLINE CHLORIDE 200 MG/10ML IV SOSY
PREFILLED_SYRINGE | INTRAVENOUS | Status: AC
  Filled 2019-11-01: qty 10

## 2019-11-01 MED ORDER — LIDOCAINE 2% (20 MG/ML) 5 ML SYRINGE
INTRAMUSCULAR | Status: DC | PRN
  Administered 2019-11-01: 80 mg via INTRAVENOUS

## 2019-11-01 MED ORDER — BUPIVACAINE LIPOSOME 1.3 % IJ SUSP
20.0000 mL | Freq: Once | INTRAMUSCULAR | Status: DC
  Filled 2019-11-01: qty 20

## 2019-11-01 MED ORDER — ACETAMINOPHEN 500 MG PO TABS
1000.0000 mg | ORAL_TABLET | ORAL | Status: AC
  Administered 2019-11-01: 1000 mg via ORAL
  Filled 2019-11-01: qty 2

## 2019-11-01 MED ORDER — ONDANSETRON HCL 4 MG/2ML IJ SOLN: 4.0000 mg | Freq: Once | INTRAMUSCULAR | Status: DC | PRN

## 2019-11-01 MED ORDER — SODIUM CHLORIDE (PF) 0.9 % IJ SOLN
INTRAMUSCULAR | Status: AC
  Filled 2019-11-01: qty 10

## 2019-11-01 MED ORDER — OXYCODONE HCL 5 MG PO TABS: 5.0000 mg | ORAL_TABLET | Freq: Once | ORAL | Status: DC | PRN

## 2019-11-01 MED ORDER — DEXAMETHASONE SODIUM PHOSPHATE 4 MG/ML IJ SOLN: 4.0000 mg | INTRAMUSCULAR | Status: DC

## 2019-11-01 MED ORDER — PROPOFOL 10 MG/ML IV BOLUS
INTRAVENOUS | Status: AC
  Filled 2019-11-01: qty 20

## 2019-11-01 MED ORDER — ORAL CARE MOUTH RINSE: 15.0000 mL | Freq: Once | OROMUCOSAL | Status: AC

## 2019-11-01 MED ORDER — SCOPOLAMINE 1 MG/3DAYS TD PT72
1.0000 | MEDICATED_PATCH | TRANSDERMAL | Status: DC
  Administered 2019-11-01: 1.5 mg via TRANSDERMAL
  Filled 2019-11-01: qty 1

## 2019-11-01 MED ORDER — BUPIVACAINE HCL (PF) 0.25 % IJ SOLN
INTRAMUSCULAR | Status: AC
  Filled 2019-11-01: qty 30

## 2019-11-01 MED ORDER — ONDANSETRON HCL 4 MG/2ML IJ SOLN
INTRAMUSCULAR | Status: AC
  Filled 2019-11-01: qty 2

## 2019-11-01 MED ORDER — PROPOFOL 10 MG/ML IV BOLUS
INTRAVENOUS | Status: DC | PRN
  Administered 2019-11-01: 150 mg via INTRAVENOUS

## 2019-11-01 MED ORDER — DEXAMETHASONE SODIUM PHOSPHATE 10 MG/ML IJ SOLN
INTRAMUSCULAR | Status: AC
  Filled 2019-11-01: qty 1

## 2019-11-01 MED ORDER — BUPIVACAINE LIPOSOME 1.3 % IJ SUSP
INTRAMUSCULAR | Status: DC | PRN
  Administered 2019-11-01: 20 mL

## 2019-11-01 MED ORDER — LIDOCAINE 2% (20 MG/ML) 5 ML SYRINGE
INTRAMUSCULAR | Status: DC | PRN
  Administered 2019-11-01: 1.5 mg/kg/h via INTRAVENOUS

## 2019-11-01 MED ORDER — SODIUM CHLORIDE 0.9% FLUSH: 3.0000 mL | Freq: Two times a day (BID) | INTRAVENOUS | Status: DC

## 2019-11-01 MED ORDER — ONDANSETRON HCL 4 MG/2ML IJ SOLN
INTRAMUSCULAR | Status: DC | PRN
  Administered 2019-11-01: 4 mg via INTRAVENOUS

## 2019-11-01 MED ORDER — LACTATED RINGERS IV SOLN: INTRAVENOUS | Status: DC

## 2019-11-01 MED ORDER — KETOROLAC TROMETHAMINE 30 MG/ML IJ SOLN
INTRAMUSCULAR | Status: AC
  Administered 2019-11-01: 30 mg via INTRAVENOUS
  Filled 2019-11-01: qty 1

## 2019-11-01 MED ORDER — LIDOCAINE 2% (20 MG/ML) 5 ML SYRINGE
INTRAMUSCULAR | Status: AC
  Filled 2019-11-01: qty 15

## 2019-11-01 MED ORDER — MIDAZOLAM HCL 2 MG/2ML IJ SOLN
INTRAMUSCULAR | Status: DC | PRN
  Administered 2019-11-01: 2 mg via INTRAVENOUS

## 2019-11-01 MED ORDER — OXYCODONE HCL 5 MG/5ML PO SOLN: 5.0000 mg | Freq: Once | ORAL | Status: DC | PRN

## 2019-11-01 MED ORDER — FENTANYL CITRATE (PF) 250 MCG/5ML IJ SOLN
INTRAMUSCULAR | Status: DC | PRN
  Administered 2019-11-01: 50 ug via INTRAVENOUS
  Administered 2019-11-01: 100 ug via INTRAVENOUS

## 2019-11-01 MED ORDER — CELECOXIB 200 MG PO CAPS
400.0000 mg | ORAL_CAPSULE | ORAL | Status: AC
  Administered 2019-11-01: 400 mg via ORAL
  Filled 2019-11-01: qty 2

## 2019-11-01 MED ORDER — CHLORHEXIDINE GLUCONATE 0.12 % MT SOLN
15.0000 mL | Freq: Once | OROMUCOSAL | Status: AC
  Administered 2019-11-01: 15 mL via OROMUCOSAL

## 2019-11-01 MED ORDER — KETAMINE HCL 10 MG/ML IJ SOLN
INTRAMUSCULAR | Status: DC | PRN
  Administered 2019-11-01: 40 mg via INTRAVENOUS

## 2019-11-01 MED ORDER — HYDROMORPHONE HCL 1 MG/ML IJ SOLN
0.2500 mg | INTRAMUSCULAR | Status: DC | PRN
  Administered 2019-11-01: 0.5 mg via INTRAVENOUS

## 2019-11-01 MED ORDER — BUPIVACAINE HCL 0.25 % IJ SOLN
INTRAMUSCULAR | Status: DC | PRN
  Administered 2019-11-01: 20 mL

## 2019-11-01 MED ORDER — KETOROLAC TROMETHAMINE 30 MG/ML IJ SOLN: 30.0000 mg | Freq: Once | INTRAMUSCULAR | Status: AC | PRN

## 2019-11-01 SURGICAL SUPPLY — 55 items
ATTRACTOMAT 16X20 MAGNETIC DRP (DRAPES) ×2 IMPLANT
BACTOSHIELD CHG 4% 4OZ (MISCELLANEOUS) ×1
BLADE EXTENDED COATED 6.5IN (ELECTRODE) ×2 IMPLANT
CELLS DAT CNTRL 66122 CELL SVR (MISCELLANEOUS) ×1 IMPLANT
CHLORAPREP W/TINT 26 (MISCELLANEOUS) ×2 IMPLANT
CLIP VESOCCLUDE LG 6/CT (CLIP) ×2 IMPLANT
CLIP VESOCCLUDE MED 6/CT (CLIP) ×2 IMPLANT
CLIP VESOCCLUDE MED LG 6/CT (CLIP) ×2 IMPLANT
CNTNR URN SCR LID CUP LEK RST (MISCELLANEOUS) IMPLANT
CONT SPEC 4OZ STRL OR WHT (MISCELLANEOUS)
COVER WAND RF STERILE (DRAPES) IMPLANT
DERMABOND ADVANCED (GAUZE/BANDAGES/DRESSINGS) ×1
DERMABOND ADVANCED .7 DNX12 (GAUZE/BANDAGES/DRESSINGS) ×1 IMPLANT
DRAPE SURG IRRIG POUCH 19X23 (DRAPES) ×2 IMPLANT
DRAPE WARM FLUID 44X44 (DRAPES) ×2 IMPLANT
DRSG OPSITE POSTOP 4X10 (GAUZE/BANDAGES/DRESSINGS) IMPLANT
DRSG OPSITE POSTOP 4X6 (GAUZE/BANDAGES/DRESSINGS) IMPLANT
DRSG OPSITE POSTOP 4X8 (GAUZE/BANDAGES/DRESSINGS) IMPLANT
ELECT REM PT RETURN 15FT ADLT (MISCELLANEOUS) ×2 IMPLANT
GAUZE 4X4 16PLY RFD (DISPOSABLE) IMPLANT
GLOVE BIO SURGEON STRL SZ 6 (GLOVE) ×4 IMPLANT
GLOVE BIO SURGEON STRL SZ 6.5 (GLOVE) ×16 IMPLANT
GOWN STRL REUS W/ TWL LRG LVL3 (GOWN DISPOSABLE) ×2 IMPLANT
GOWN STRL REUS W/TWL LRG LVL3 (GOWN DISPOSABLE) ×2
HEMOSTAT ARISTA ABSORB 3G PWDR (HEMOSTASIS) IMPLANT
KIT BASIN OR (CUSTOM PROCEDURE TRAY) ×2 IMPLANT
KIT TURNOVER KIT A (KITS) IMPLANT
LOOP VESSEL MAXI BLUE (MISCELLANEOUS) IMPLANT
NEEDLE HYPO 22GX1.5 SAFETY (NEEDLE) ×4 IMPLANT
NS IRRIG 1000ML POUR BTL (IV SOLUTION) ×4 IMPLANT
PACK GENERAL/GYN (CUSTOM PROCEDURE TRAY) ×2 IMPLANT
PENCIL SMOKE EVACUATOR (MISCELLANEOUS) IMPLANT
RETRACTOR WND ALEXIS 25 LRG (MISCELLANEOUS) IMPLANT
RTRCTR WOUND ALEXIS 18CM MED (MISCELLANEOUS) ×2
RTRCTR WOUND ALEXIS 25CM LRG (MISCELLANEOUS)
SCRUB CHG 4% DYNA-HEX 4OZ (MISCELLANEOUS) ×1 IMPLANT
SHEET LAVH (DRAPES) ×2 IMPLANT
SLEEVE SUCTION CATH 165 (SLEEVE) ×2 IMPLANT
SPONGE LAP 18X18 RF (DISPOSABLE) IMPLANT
SURGIFLO W/THROMBIN 8M KIT (HEMOSTASIS) IMPLANT
SUT MNCRL AB 4-0 PS2 18 (SUTURE) ×4 IMPLANT
SUT PDS AB 1 TP1 96 (SUTURE) ×4 IMPLANT
SUT VIC AB 2-0 CT1 36 (SUTURE) ×4 IMPLANT
SUT VIC AB 2-0 CT2 27 (SUTURE) ×12 IMPLANT
SUT VIC AB 2-0 SH 27 (SUTURE)
SUT VIC AB 2-0 SH 27X BRD (SUTURE) IMPLANT
SUT VIC AB 3-0 CTX 36 (SUTURE) IMPLANT
SUT VIC AB 3-0 SH 18 (SUTURE) IMPLANT
SUT VIC AB 3-0 SH 27 (SUTURE) ×1
SUT VIC AB 3-0 SH 27X BRD (SUTURE) ×1 IMPLANT
SYR 30ML LL (SYRINGE) ×4 IMPLANT
TOWEL OR 17X26 10 PK STRL BLUE (TOWEL DISPOSABLE) ×2 IMPLANT
TOWEL OR NON WOVEN STRL DISP B (DISPOSABLE) ×2 IMPLANT
TRAY FOLEY MTR SLVR 16FR STAT (SET/KITS/TRAYS/PACK) ×2 IMPLANT
UNDERPAD 30X36 HEAVY ABSORB (UNDERPADS AND DIAPERS) ×2 IMPLANT

## 2019-11-01 NOTE — Anesthesia Postprocedure Evaluation (Signed)
Anesthesia Post Note  Patient: Michelle Buchanan  Procedure(s) Performed: OPEN LEFT SALPINGO OOPHORECTOMY WITH A MINI LAPAROTOMY, WASHINGS (Left )     Patient location during evaluation: PACU Anesthesia Type: General Level of consciousness: awake and alert Pain management: pain level controlled Vital Signs Assessment: post-procedure vital signs reviewed and stable Respiratory status: spontaneous breathing, nonlabored ventilation, respiratory function stable and patient connected to nasal cannula oxygen Cardiovascular status: blood pressure returned to baseline and stable Postop Assessment: no apparent nausea or vomiting Anesthetic complications: no   No complications documented.  Last Vitals:  Vitals:   11/01/19 1530 11/01/19 1544  BP: 123/71 128/78  Pulse: 70 69  Resp: 13 16  Temp:  36.9 C  SpO2: 96% 94%    Last Pain:  Vitals:   11/01/19 1544  TempSrc:   PainSc: 6                  Barnet Glasgow

## 2019-11-01 NOTE — Discharge Instructions (Signed)
11/01/2019  Return to work: 4 weeks  Activity: 1. Be up and out of the bed during the day.  Take a nap if needed.  You may walk up steps but be careful and use the hand rail.  Stair climbing will tire you more than you think, you may need to stop part way and rest.   2. No lifting or straining for 6 weeks.  3. No driving for 1 week.  Do Not drive if you are taking narcotic pain medicine.  4. Shower daily.  Use soap and water on your incision and pat dry; don't rub.    Medications:  - Take ibuprofen and tylenol first line for pain control. Take these regularly (every 6 hours) to decrease the build up of pain.  - If necessary, for severe pain not relieved by ibuprofen, take oxycodone.  - While taking oxycodone you should take sennakot every night to reduce the likelihood of constipation. If this causes diarrhea, stop its use.  Diet: 1. Low sodium Heart Healthy Diet is recommended.  2. It is safe to use a laxative if you have difficulty moving your bowels.   Wound Care: 1. Keep clean and dry.  Shower daily. 2. You can get the dressing wet in the shower, but avoid tub baths. 3. Remove the dressing between 5 and 7 days after your surgery (1 week after your operation). 4. After the dressing is removed you can get the incision wet in the shower, however continue to avoid tub baths until advised otherwise by your surgeon at follow-up.   Reasons to call the Doctor:   Fever - Oral temperature greater than 100.4 degrees Fahrenheit  Foul-smelling vaginal discharge  Difficulty urinating  Nausea and vomiting  Increased pain at the site of the incision that is unrelieved with pain medicine.  Difficulty breathing with or without chest pain  New calf pain especially if only on one side  Sudden, continuing increased vaginal bleeding with or without clots.   Follow-up: 1. See Everitt Amber in 3 weeks.  Contacts: For questions or concerns you should contact:  Dr. Everitt Amber at  [phone] After hours and on week-ends call [phone] and ask to speak to the physician on call for Gynecologic Oncology     Salpingooforectoma unilateral, cuidados posteriores Unilateral Salpingo-Oophorectomy, Care After Lea esta informacin sobre cmo cuidarse despus del procedimiento. Su mdico tambin podr darle indicaciones ms especficas. Comunquese con su mdico si tiene problemas o preguntas. Qu puedo esperar despus del procedimiento? Despus del procedimiento, es comn [employer] siguientes sntomas:  Dolor abdominal.  Algn sangrado vaginal ocasional (pequeas manchas).  Cansancio. Siga estas indicaciones en su casa: Cuidados de la incisin   Mantenga la zona de la incisin y el vendaje limpios y secos.  Siga las indicaciones del mdico acerca del cuidado de la incisin. Haga lo siguiente: ? Lvese las manos con agua y [place] antes de cambiar el vendaje. Use desinfectante para manos si no dispone de [place] y [place]. ? Cambie el vendaje como se lo haya indicado el mdico. ? No retire los puntos (suturas), las grapas, la goma para cerrar la piel o las tiras [place]. Es posible que estos cierres cutneos Animal nutritionist en la piel durante 2semanas o ms tiempo. Si los bordes de las tiras adhesivas empiezan a despegarse y Therapist, sports, puede recortar los que estn sueltos. No retire las tiras [facility] por completo a menos que el mdico se lo indique.  Penitas zona de  la incisin para detectar signos de infeccin. Est atento a los siguientes signos: ? Dolor, hinchazn o enrojecimiento. ? Lquido o sangre. ? Calor. ? Pus o mal olor. Actividad  No conduzca ni use maquinaria pesada mientras toma analgsicos recetados.  No conduzca durante 24horas si le administraron un medicamento para ayudarlo a que se relaje (sedante).  Haga caminatas cortas y frecuentes Agricultural consultant. Descanse cuando se sienta cansada. Pregntele al mdico qu actividades  son seguras para usted.  Evite actividades que requieran un gran esfuerzo. Tambin, evite levantar pesos [employer]. No levante ningn objeto que pese ms de 5libras (2,3kg) o el lmite de peso que le indique su mdico hasta que este le diga que puede [place].  No se haga duchas vaginales, no use tampones ni tenga relaciones sexuales hasta que su mdico lo autorice. Instrucciones generales  A fin de prevenir o tratar el estreimiento mientras toma analgsicos recetados, el mdico puede recomendarle lo siguiente: ? Beber suficiente lquido para mantener la orina de color amarillo plido. ? Tomar medicamentos recetados o de [employer]. ? Consumir alimentos ricos en fibra, como frutas y verduras frescas, cereales integrales y frijoles. ? Limitar el consumo de alimentos ricos en grasas y azcares procesados, como alimentos fritos o dulces.  Tome los medicamentos de venta libre y los recetados solamente como se lo haya indicado el mdico.  No tome baos de inmersin, no nade ni use el jacuzzi hasta que el mdico lo autorice. Pregntele al mdico si puede ducharse. Thurston Pounds solo le permitan darse baos de [place].  Use las medias de compresin como se lo haya indicado el mdico. Estas medias ayudan a Mining engineer formacin de cogulos de [place] y a reducir la hinchazn de las piernas.  Concurra a todas las visitas de control como se lo haya indicado el mdico. Esto es importante. Comunquese con un mdico si:  Siente dolor al [employer].  Tiene una secrecin con feo olor o pus que proviene de la vagina.  Tiene enrojecimiento, hinchazn o dolor alrededor de la incisin.  Le sale lquido o sangre de la incisin.  La incisin est caliente al tacto.  Tiene pus o percibe que sale mal olor del lugar de la incisin.  Tiene fiebre.  La incisin comienza a abrirse.  Tiene un dolor abdominal que empeora o que no mejora con los medicamentos.  Le aparece una erupcin cutnea.  Presenta nuseas o  vmitos.  Se siente mareado. Solicite ayuda de inmediato si:  Tree surgeon en el pecho o en la pierna.  Le falta el aire.  Se desmaya.  Observa un aumento de la hemorragia vaginal. Resumen  Despus del procedimiento, es comn Patent attorney, cansancio y sangrado ocasional de la vagina.  Siga las indicaciones del mdico acerca del cuidado de la incisin.  Controle la incisin todos los das para detectar signos de infeccin e informe sobre cualquier sntoma a su mdico.  Siga las indicaciones del mdico respecto de las actividades y Futures trader. Esta informacin no tiene Marine scientist el consejo del mdico. Asegrese de hacerle al mdico cualquier pregunta que tenga. Document Revised: 07/27/2016 Document Reviewed: 07/27/2016 Elsevier Patient Education  Section.

## 2019-11-01 NOTE — Interval H&P Note (Signed)
History and Physical Interval Note:  11/01/2019 11:17 AM  [facility]  has presented today for surgery, with the diagnosis of PELVIC MASS.  The various methods of treatment have been discussed with the patient and family. After consideration of risks, benefits and other options for treatment, the patient has consented to  Procedure(s): OPEN UNILATERAL SALPINGO OOPHORECTOMY WITH A MINI LAPAROTOMY (N/A) as a surgical intervention.  The patient's history has been reviewed, patient examined, no change in status, stable for surgery.  I have reviewed the patient's chart and labs.  Questions were answered to the patient's satisfaction.     Thereasa Solo

## 2019-11-01 NOTE — Transfer of Care (Signed)
Immediate Anesthesia Transfer of Care Note  Patient: Michelle Buchanan  Procedure(s) Performed: OPEN LEFT SALPINGO OOPHORECTOMY WITH A MINI LAPAROTOMY, WASHINGS (Left )  Patient Location: PACU  Anesthesia Type:General  Level of Consciousness: drowsy  Airway & Oxygen Therapy: Patient Spontanous Breathing and Patient connected to face mask oxygen  Post-op Assessment: Report given to RN and Post -op Vital signs reviewed and stable  Post vital signs: Reviewed and stable  Last Vitals:  Vitals Value Taken Time  BP 143/73 11/01/19 1414  Temp    Pulse 81 11/01/19 1414  Resp 13 11/01/19 1414  SpO2 100 % 11/01/19 1414  Vitals shown include unvalidated device data.  Last Pain:  Vitals:   11/01/19 1150  TempSrc: Oral         Complications: No complications documented.

## 2019-11-01 NOTE — Anesthesia Procedure Notes (Signed)
Procedure Name: Intubation Date/Time: 11/01/2019 12:42 PM Performed by: Sharlette Dense, CRNA Patient Re-evaluated:Patient Re-evaluated prior to induction Oxygen Delivery Method: Circle system utilized Preoxygenation: Pre-oxygenation with 100% oxygen Ventilation: Mask ventilation without difficulty and Oral airway inserted - appropriate to patient size Laryngoscope Size: Sabra Heck and 2 Grade View: Grade I Tube type: Oral Tube size: 7.5 mm Number of attempts: 1 Airway Equipment and Method: Stylet Placement Confirmation: ETT inserted through vocal cords under direct vision,  positive ETCO2 and breath sounds checked- equal and bilateral Secured at: 21 cm Tube secured with: Tape Dental Injury: Teeth and Oropharynx as per pre-operative assessment

## 2019-11-01 NOTE — Op Note (Addendum)
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  Surgeon: Thereasa Solo, MD.  Assistant Surgeon: Lahoma Crocker, M.D. Assistant: (an MD assistant was necessary for tissue manipulation, retraction and positioning due to the complexity of the case and hospital policies).   Specimens: left tube and ovary, washings   Estimated Blood Loss: 20 mL.    Urine [id number]  Complications: None.   Operative Findings: 25cm smooth cyst replacing the left tube and ovary. Normal appearing uterus with lower uterine segment fibroid palpable. Normal right ovary to palpate. Normal omentum and upper abdomen.      Procedure:   The patient was seen in the Holding Room. The risks, benefits, complications, treatment options, and expected outcomes were discussed with the patient.  The patient concurred with the proposed plan, giving informed consent.   The patient was  identified as [facility]  and the procedure verified as USO, possible staging. A Time Out was held and the above information confirmed upon entry to the operating room..  After induction of anesthesia, the patient was draped and prepped in the usual sterile manner.  She was prepped and draped in the normal sterile fashion in the dorsal lithotomy position in padded Allen stirrups with good attention paid to support of the lower back and lower extremities. Position was adjusted for appropriate support. A Foley catheter was placed to gravity.   A 6cm infraumbilical midline vertical incision was made and carried through the subcutaneous tissue to the fascia. The fascial incision was made and extended superiorally. The rectus muscles were separated. The peritoneum was identified and entered. Peritoneal incision was extended longitudinally.  The  abdominal cavity was entered sharply and without incident. A Bookwalter retractor was then placed. A survey of the abdomen and pelvis revealed the above findings, which were significant for a 25cm left ovarian cyst filling the abdomen and pelvis.  Washings were obtained from the peritoneal cavity.  An alexis retractor was placed.  The cystic mass was identified and brought to the incision. A gallbladder trochar was used to puncture the cyst and evacuate it of fluid to decompress it. No gross spillage of cyst fluid took place.Allis clamps were used to elevated the puncture site as the cyst was deflated.  The deflated cystic mass was delivered through the abdominal incision.  A window was made in the medial leaf of the broad ligament above the level of the ureter. The left utero-ovarian ligament and infundibulopelvic ligament were skeletonized and sealed and transected with the ligasure. This liberated the ovary from its attachments. It was sent for frozen section which revealed a mucinous cystadenoma. Hemostasis was observed at the utero-ovarian and IP ligaments.  The retroperitoneum on the left was opened and the ureter was identified in the medial leaf of the broad ligament deep to the surgical pedicles. The uterus and contralateral ovary were inspected and noted to be normal.  The peritoneal cavity was irrigated and hemostasis was confirmed at all surgical sites.  The fascia was reapproximated with #1 looped PDS using a total of two sutures. The subcutaneous layer was then irrigated copiously.  Exparel long acting local anesthetic was infiltrated into the subcutaneous tissues. The skin was closed with subcuticular suture. The patient tolerated the procedure well.   Sponge, lap and needle counts were correct x 2.   Donaciano Eva, MD

## 2019-11-02 ENCOUNTER — Telehealth: Payer: Self-pay

## 2019-11-02 ENCOUNTER — Encounter (HOSPITAL_COMMUNITY): Payer: Self-pay | Admitting: Gynecologic Oncology

## 2019-11-02 LAB — CYTOLOGY - NON PAP

## 2019-11-02 NOTE — Telephone Encounter (Signed)
Daughter Sharyn Lull states that her mother is eating, drinking, and urinating well. She is passing gas. Told daughter to have her begin the senokot-s 2 tabs with 8 oz of water bid until BM. Afebrile. Incision D&I. Told daughter that the honeycomb dressing can be removed 5 -7 days after surgery.  This would be 11-05-09-4-21. Daughter and patient aware of her post op appointments and the office number [phone] to call if she has any questions or concerns.

## 2019-11-05 LAB — SURGICAL PATHOLOGY

## 2019-11-06 ENCOUNTER — Telehealth: Payer: Self-pay

## 2019-11-06 NOTE — Telephone Encounter (Signed)
Told daughter Sharyn Lull that the left ovary,left fallopian tube,and pelvic washing showed no cancer per Zoila Shutter Daughter will give this good news to her mother.

## 2019-11-22 ENCOUNTER — Encounter: Payer: Self-pay | Admitting: Gynecologic Oncology

## 2019-11-23 ENCOUNTER — Other Ambulatory Visit: Payer: Self-pay

## 2019-11-23 ENCOUNTER — Encounter: Payer: Self-pay | Admitting: Gynecologic Oncology

## 2019-11-23 ENCOUNTER — Inpatient Hospital Stay: Payer: Self-pay | Attending: Gynecologic Oncology | Admitting: Gynecologic Oncology

## 2019-11-23 VITALS — BP 137/66 | HR 60 | Temp 98.2°F | Resp 18 | Ht 62.0 in | Wt 177.0 lb

## 2019-11-23 DIAGNOSIS — D271 Benign neoplasm of left ovary: Secondary | ICD-10-CM | POA: Insufficient documentation

## 2019-11-23 DIAGNOSIS — Z90721 Acquired absence of ovaries, unilateral: Secondary | ICD-10-CM | POA: Insufficient documentation

## 2019-11-23 DIAGNOSIS — R1900 Intra-abdominal and pelvic swelling, mass and lump, unspecified site: Secondary | ICD-10-CM

## 2019-11-23 NOTE — Progress Notes (Signed)
Follow-up Note: Gyn-Onc Patient seen with interpretor present.   Consult was requested by Dr. Elgie Congo for the evaluation of Michelle Buchanan 54 y.o. female  CC:  Chief Complaint  Patient presents with  . Pelvic mass    Assessment/Plan:  [name]  is a 54 y.o.  year old 3 weeks s/p minilap LSO for mucinous cystadenofibroma on 11/01/19. No additional follow-up necessary. She will follow-up with Dr Elgie Congo for ongoing gynecologic care.  HPI: [name] is a 54 year old P2 who was seen in consultation at the request of Dr Lynnda Shields for evaluation of a large ovarian cyst.  The patient first noticed abdominal distention and pain with squatting to lifting in March 2021.  She was seen by the health department in May 2021 and ultrasound scan was performed which revealed a uterus measuring 8.8 x 4.8 x 5.1 cm and a small fibroid measuring 3.6 cm.  There is a cystic structure which was felt at that time to be in the right pelvis and measured 14.5 x 15 x 14.4 cm it was multiseptated.  She was referred to an OB/GYN.  She saw Dr. Lynnda Shields on September 27, 2019 and a transvaginal ultrasound scan was ordered.  This was performed on October 03, 2019 and revealed a uterus measuring 9.6 x 5.2 x 5.5 cm with small leiomyomas within it.  The endometrium was thin and normal at 3 mm.  The right ovary was grossly normal at 2.8 x 0.9 x 1.8 cm.  The left ovary was not visualized.  However a large cystic mass was seen within the pelvis likely arising from the left adnexa extending above the umbilicus measuring 46.9 x 11.3 x 19.9 cm.  It contained a small amount of debris.  There was no mural nodularity or papillary excrescences.  There was a single thin septation.  There was no abnormal blood flow on color or color Doppler imaging.  No pelvic free fluid.  Ca1 25 was drawn on September 27, 2019 and was normal at 12.6, as was CEA at 2.0.  Interval Hx:  On 11/01/19 she underwent  minilaparotomy, LSO. Intraoperative findings were significant for a 25cm smooth cyst replacing the left tube and ovary. Surgery was uncomplicated.  Final pathology revealed a benign mucinous ovarian cystadenoma.  Since surgery she has done well with no concerns.   Current Meds:  Outpatient Encounter Medications as of 11/23/2019  Medication Sig  . acetaminophen (TYLENOL) 500 MG tablet Take 500 mg by mouth every 6 (six) hours as needed for headache.  Marland Kitchen aspirin-sod bicarb-citric acid (ALKA-SELTZER) 325 MG TBEF tablet Take 325 mg by mouth every 6 (six) hours as needed (indigestion).  Marland Kitchen ibuprofen (ADVIL) 800 MG tablet Take 1 tablet (800 mg total) by mouth every 8 (eight) hours as needed for moderate pain. For AFTER surgery only (Patient not taking: Reported on 11/22/2019)  . oxyCODONE (OXY IR/ROXICODONE) 5 MG immediate release tablet Take 1 tablet (5 mg total) by mouth every 4 (four) hours as needed for severe pain. For AFTER surgery, do not take and drive (Patient not taking: Reported on 11/22/2019)  . senna-docusate (SENOKOT-S) 8.6-50 MG tablet Take 2 tablets by mouth at bedtime. For AFTER surgery, do not take if having diarrhea (Patient not taking: Reported on 11/22/2019)   No facility-administered encounter medications on file as of 11/23/2019.    Allergy:  Allergies  Allergen Reactions  . Pollen Extract     Social Hx:   Social History  Socioeconomic History  . Marital status: Single    Spouse name: Not on file  . Number of children: Not on file  . Years of education: Not on file  . Highest education level: Not on file  Occupational History  . Not on file  Tobacco Use  . Smoking status: Never Smoker  . Smokeless tobacco: Never Used  Vaping Use  . Vaping Use: Never used  Substance and Sexual Activity  . Alcohol use: Never  . Drug use: Never  . Sexual activity: Not Currently  Other Topics Concern  . Not on file  Social History Narrative  . Not on file   Social  Determinants of Health   Financial Resource Strain:   . Difficulty of Paying Living Expenses: Not on file  Food Insecurity:   . Worried About Charity fundraiser in the Last Year: Not on file  . Ran Out of Food in the Last Year: Not on file  Transportation Needs:   . Lack of Transportation (Medical): Not on file  . Lack of Transportation (Non-Medical): Not on file  Physical Activity:   . Days of Exercise per Week: Not on file  . Minutes of Exercise per Session: Not on file  Stress:   . Feeling of Stress : Not on file  Social Connections:   . Frequency of Communication with Friends and Family: Not on file  . Frequency of Social Gatherings with Friends and Family: Not on file  . Attends Religious Services: Not on file  . Active Member of Clubs or Organizations: Not on file  . Attends Archivist Meetings: Not on file  . Marital Status: Not on file  Intimate Partner Violence:   . Fear of Current or Ex-Partner: Not on file  . Emotionally Abused: Not on file  . Physically Abused: Not on file  . Sexually Abused: Not on file    Past Surgical Hx:  Past Surgical History:  Procedure Laterality Date  . ANKLE SURGERY  07/13/2019  . SALPINGOOPHORECTOMY Left 11/01/2019   Procedure: OPEN LEFT SALPINGO OOPHORECTOMY WITH A MINI LAPAROTOMY, WASHINGS;  Surgeon: Everitt Amber, MD;  Location: [facility];  Service: Gynecology;  Laterality: Left;    Past Medical Hx:  Past Medical History:  Diagnosis Date  . High cholesterol   . Pre-diabetes   . Prediabetes     Past Gynecological History:  See HPI No LMP recorded (lmp unknown). Patient is postmenopausal.  Family Hx:  Family History  Problem Relation Age of Onset  . Hypertension Mother   . Hyperlipidemia Mother   . Diabetes Sister   . Hyperlipidemia Sister   . Diabetes Brother   . Hyperlipidemia Brother     Review of Systems:  Constitutional  Feels well,    ENT Normal appearing ears and nares bilaterally Skin/Breast  No  rash, sores, jaundice, itching, dryness Cardiovascular  No chest pain, shortness of breath, or edema  Pulmonary  No cough or wheeze.  Gastro Intestinal  No nausea, vomitting, or diarrhoea. No bright red blood per rectum, change in bowel movement, or constipation.  Genito Urinary  No frequency, urgency, dysuria, no pain.  Musculo Skeletal  Back and shoulder pain Neurologic  No weakness, numbness, change in gait,  Psychology  No depression, anxiety, insomnia.   Vitals:  Blood pressure 137/66, pulse 60, temperature 98.2 F (36.8 C), temperature source Tympanic, resp. rate 18, height 5\' 2"  (1.575 m), weight 177 lb (80.3 kg), SpO2 98 %.  Physical Exam: WD in  NAD Neck  Supple NROM, without any enlargements.  Lymph Node Survey No cervical supraclavicular or inguinal adenopathy Cardiovascular  Pulse normal rate, regularity and rhythm. S1 and S2 normal.  Lungs  Clear to auscultation bilateraly, without wheezes/crackles/rhonchi. Good air movement.  Skin  No rash/lesions/breakdown  Psychiatry  Alert and oriented to person, place, and time  Abdomen  Soft, incision healing normally, small area at superior aspect with suture present, no infection.    Thereasa Solo, MD  11/23/2019, 3:35 PM

## 2019-11-23 NOTE — Patient Instructions (Addendum)
Dr Denman George removed your left tube and ovary. It showed a benign cyst and no cancer. You still have your uterus and right tube and ovary.  It is safe to return to work on Monday.  Please follow up with Dr Elgie Congo for any ongoing gynecologic care.

## 2021-09-15 IMAGING — CT CT ABD-PELV W/ CM
3 of 5 series · 16 of 46 positions shown, 18 images · IV contrast (OMNIPAQUE)
Comparison: 10/03/2019 pelvic sonogram.

CLINICAL DATA: Large cystic pelvic mass on recent ultrasound.
Abdominal distension and pain for 6 months.

EXAM:
CT ABDOMEN AND PELVIS WITH CONTRAST
TECHNIQUE: Multidetector CT imaging of the abdomen and pelvis was performed
using the standard protocol following bolus administration of
intravenous contrast.
CONTRAST:  100mL OMNIPAQUE IOHEXOL 300 MG/ML  SOLN

[Series 2: axial st · axial · 0.80mm/px · z∈[-446,-46]mm · 11 of 96 slices shown, 13 images]
[im 8/96  soft-tissue]
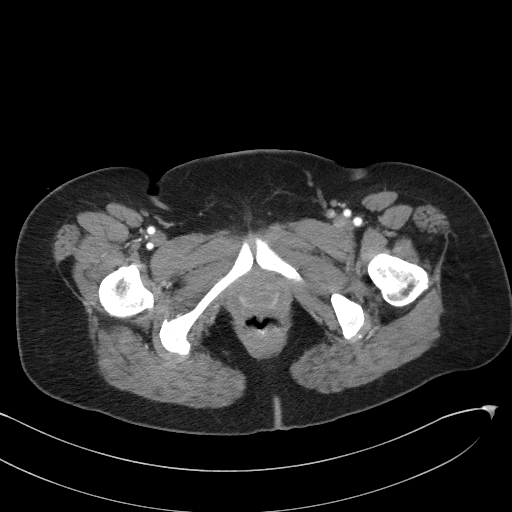
[im 8/96  bone]
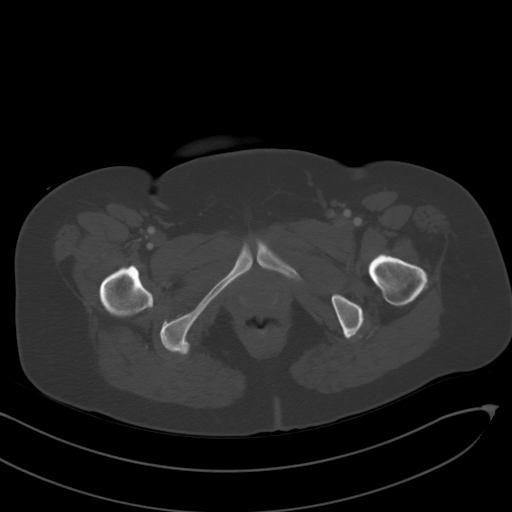
[im 16/96  soft-tissue]
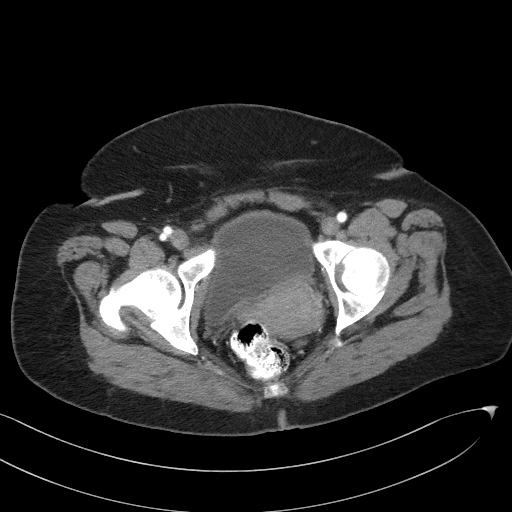
[im 24/96  soft-tissue]
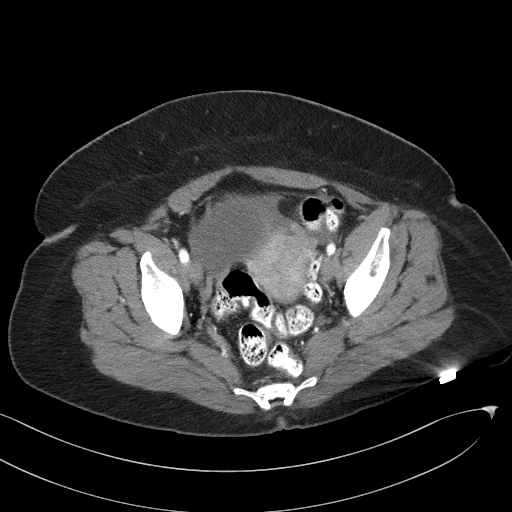
[im 32/96  soft-tissue]
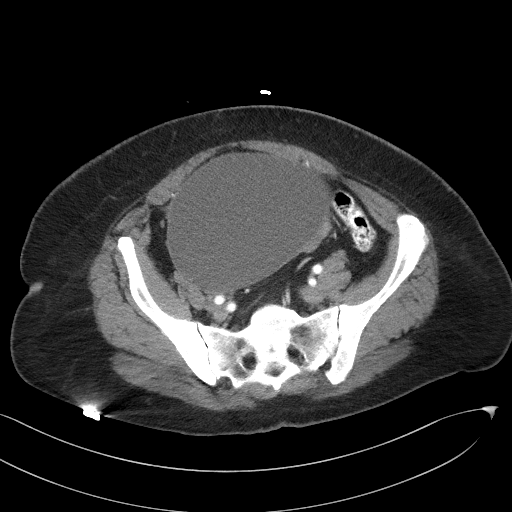
[im 40/96  soft-tissue]
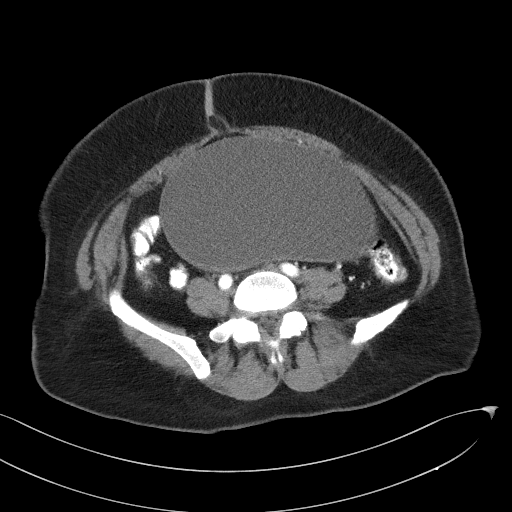
[im 48/96  soft-tissue]
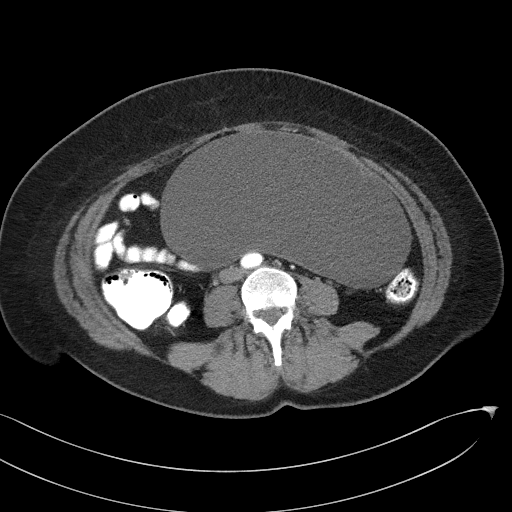
[im 56/96  soft-tissue]
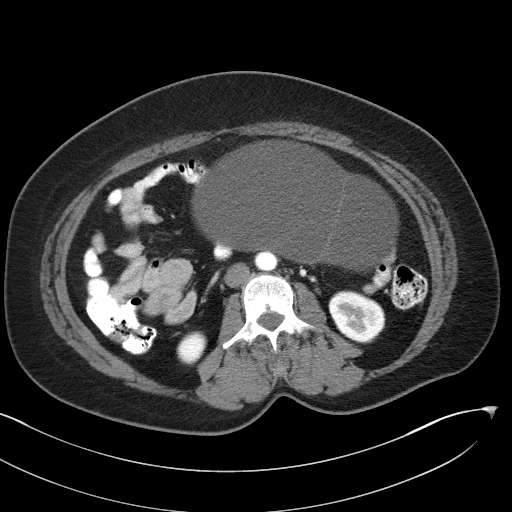
[im 64/96  soft-tissue]
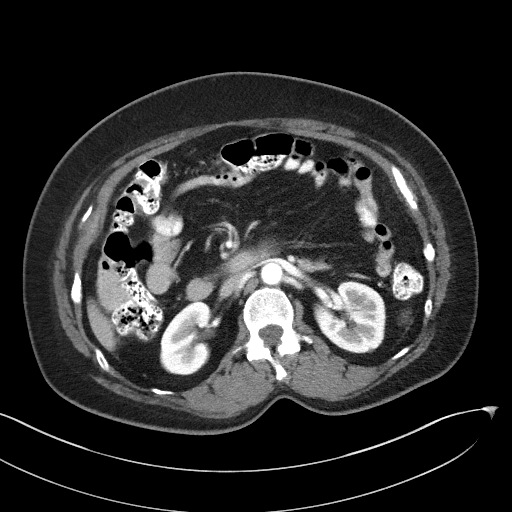
[im 72/96  soft-tissue]
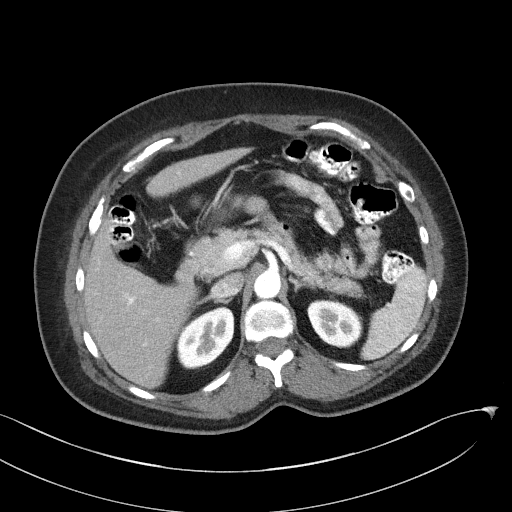
[im 72/96  bone]
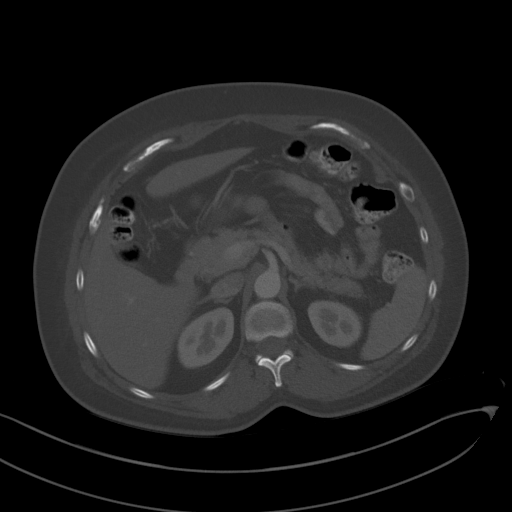
[im 80/96  soft-tissue]
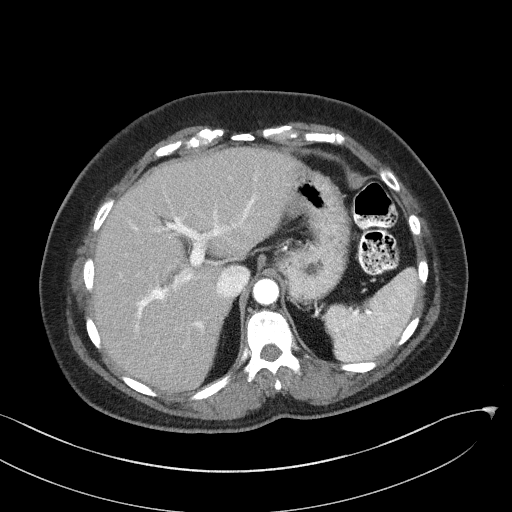
[im 88/96  soft-tissue]
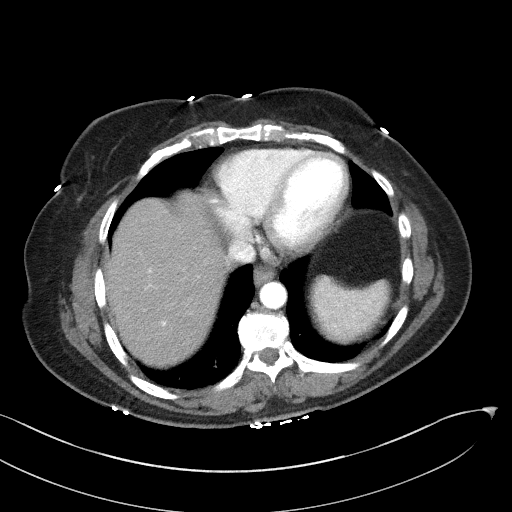

[Series 4: lung bases · axial · 0.80mm/px · z∈[-102,-86]mm · 2 of 56 slices shown]
[im 8/56  bone]
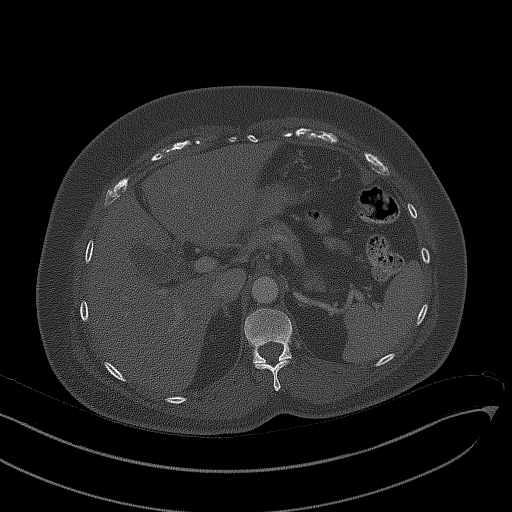
[im 16/56  bone]
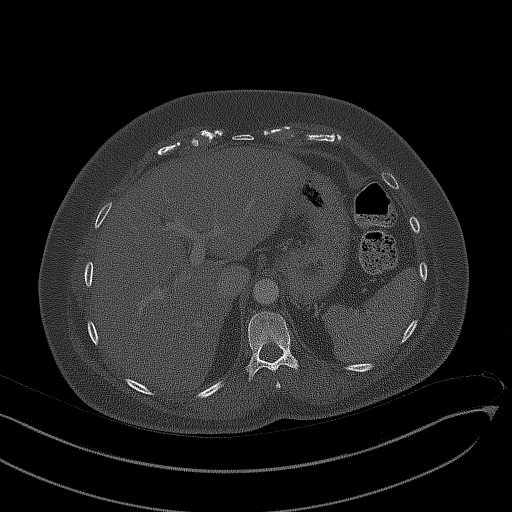

[Series 5: coronal st · coronal · 0.78mm/px · 3 of 106 slices shown]
[im 36/106  soft-tissue]
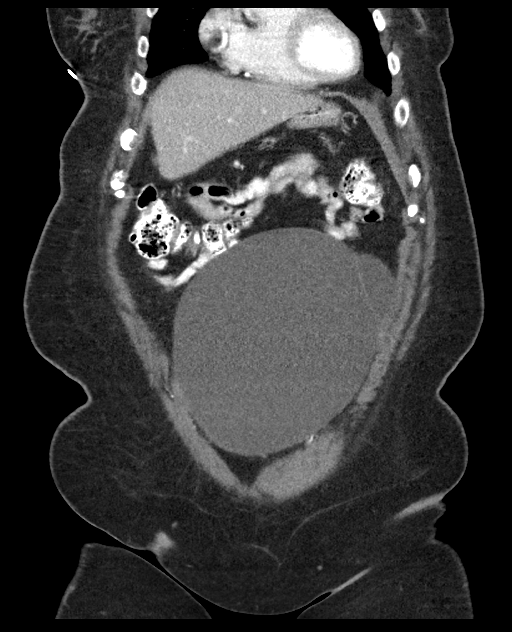
[im 47/106  soft-tissue]
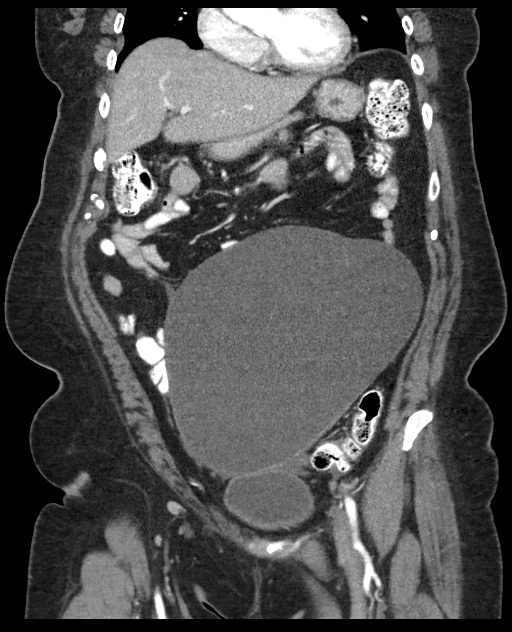
[im 59/106  soft-tissue]
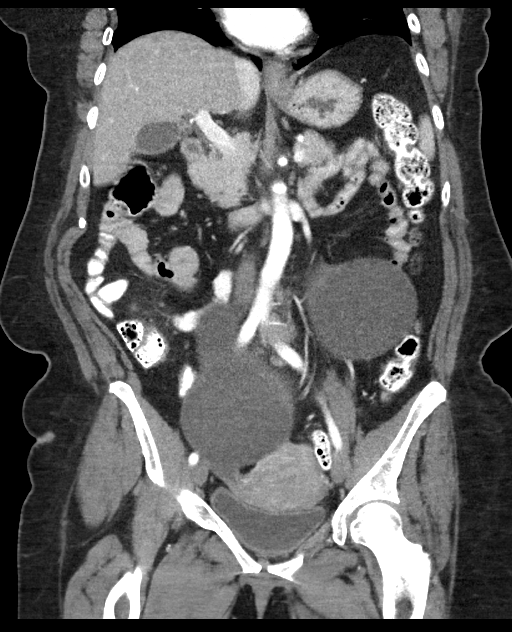

[16 of 46 positions shown; findings below may reference images not displayed]

FINDINGS: Lower chest: No significant pulmonary nodules or acute consolidative
airspace disease.

Hepatobiliary: Normal liver size. No liver mass. Normal gallbladder
with no radiopaque cholelithiasis. No biliary ductal dilatation.

Pancreas: Normal, with no mass or duct dilation.

Spleen: Normal size. No mass.

Adrenals/Urinary Tract: Normal adrenals. No hydronephrosis.
Subcentimeter hypodense renal cortical lesions in the upper kidneys
bilaterally are too small to characterize and require no follow-up.
No suspicious renal masses. Normal bladder.

Stomach/Bowel: Normal non-distended stomach. Normal caliber small
bowel with no small bowel wall thickening. Normal appendix. Oral
contrast transits to the rectum. Normal large bowel with no
diverticulosis, large bowel wall thickening or pericolonic fat
stranding.

Vascular/Lymphatic: Normal caliber abdominal aorta. Patent portal,
splenic, hepatic and renal veins. No pathologically enlarged lymph
nodes in the abdomen or pelvis.

Reproductive: Normal size anteverted uterus with nonspecific
myometrial heterogeneity. There is a large 20.5 x 11.7 x 18.8 cm
cystic mass centered to the left of midline in the lower abdomen and
upper pelvis, with a thin eccentric internal septation and no solid
enhancement or wall thickening.

Other: No pneumoperitoneum, ascites or focal fluid collection.

Musculoskeletal: No aggressive appearing focal osseous lesions.
IMPRESSION: 1. Large 20.5 x 11.7 x 18.8 cm minimally complex septated cystic
mass centered to the left of midline in the lower abdomen and upper
pelvis, favoring a large cystic ovarian neoplasm of uncertain
laterality, favor left ovarian origin. Malignancy not excluded.
2. No lymphadenopathy or other findings of metastatic disease in the
abdomen or pelvis.
